# Patient Record
Sex: Female | Born: 1995 | Race: Black or African American | Hispanic: No | Smoking: Never smoker
Health system: Southern US, Community
[De-identification: ages and names within clinical notes are randomized; demographics above are authoritative.]

## PROBLEM LIST (undated history)

## (undated) ENCOUNTER — Emergency Department (HOSPITAL_COMMUNITY): Admission: EM | Payer: Medicaid Other | Source: Home / Self Care

## (undated) DIAGNOSIS — J45909 Unspecified asthma, uncomplicated: Secondary | ICD-10-CM

## (undated) DIAGNOSIS — B379 Candidiasis, unspecified: Secondary | ICD-10-CM

## (undated) DIAGNOSIS — N390 Urinary tract infection, site not specified: Secondary | ICD-10-CM

## (undated) DIAGNOSIS — B9689 Other specified bacterial agents as the cause of diseases classified elsewhere: Secondary | ICD-10-CM

## (undated) DIAGNOSIS — N760 Acute vaginitis: Secondary | ICD-10-CM

## (undated) HISTORY — PX: BREAST SURGERY: SHX581

## (undated) HISTORY — PX: EYE SURGERY: SHX253

---

## 2016-05-02 ENCOUNTER — Encounter (HOSPITAL_COMMUNITY): Payer: Self-pay | Admitting: Emergency Medicine

## 2016-05-02 ENCOUNTER — Other Ambulatory Visit: Payer: Self-pay

## 2016-05-02 ENCOUNTER — Emergency Department (HOSPITAL_COMMUNITY): Payer: Medicaid Other

## 2016-05-02 ENCOUNTER — Emergency Department (HOSPITAL_COMMUNITY)
Admission: EM | Admit: 2016-05-02 | Discharge: 2016-05-02 | Disposition: A | Discharge status: Home / Self Care | Payer: Medicaid Other | Attending: Emergency Medicine | Admitting: Emergency Medicine

## 2016-05-02 DIAGNOSIS — R0789 Other chest pain: Secondary | ICD-10-CM

## 2016-05-02 DIAGNOSIS — R0602 Shortness of breath: Secondary | ICD-10-CM

## 2016-05-02 DIAGNOSIS — J45909 Unspecified asthma, uncomplicated: Secondary | ICD-10-CM | POA: Diagnosis not present

## 2016-05-02 HISTORY — DX: Unspecified asthma, uncomplicated: J45.909

## 2016-05-02 LAB — CBC WITH DIFFERENTIAL/PLATELET
BASOS ABS: 0 10*3/uL (ref 0.0–0.1)
Basophils Relative: 0 %
EOS ABS: 0.2 10*3/uL (ref 0.0–0.7)
EOS PCT: 3 %
HCT: 36.7 % (ref 36.0–46.0)
Hemoglobin: 12.7 g/dL (ref 12.0–15.0)
Lymphocytes Relative: 34 %
Lymphs Abs: 2.4 10*3/uL (ref 0.7–4.0)
MCH: 27.3 pg (ref 26.0–34.0)
MCHC: 34.6 g/dL (ref 30.0–36.0)
MCV: 78.9 fL (ref 78.0–100.0)
Monocytes Absolute: 0.6 10*3/uL (ref 0.1–1.0)
Monocytes Relative: 9 %
Neutro Abs: 3.9 10*3/uL (ref 1.7–7.7)
Neutrophils Relative %: 54 %
PLATELETS: 300 10*3/uL (ref 150–400)
RBC: 4.65 MIL/uL (ref 3.87–5.11)
RDW: 13.3 % (ref 11.5–15.5)
WBC: 7.2 10*3/uL (ref 4.0–10.5)

## 2016-05-02 LAB — URINALYSIS, ROUTINE W REFLEX MICROSCOPIC
BILIRUBIN URINE: NEGATIVE
Glucose, UA: NEGATIVE mg/dL
HGB URINE DIPSTICK: NEGATIVE
Ketones, ur: NEGATIVE mg/dL
Leukocytes, UA: NEGATIVE
NITRITE: NEGATIVE
PH: 6 (ref 5.0–8.0)
Protein, ur: NEGATIVE mg/dL
SPECIFIC GRAVITY, URINE: 1.024 (ref 1.005–1.030)

## 2016-05-02 LAB — I-STAT TROPONIN, ED: Troponin i, poc: 0 ng/mL (ref 0.00–0.08)

## 2016-05-02 LAB — COMPREHENSIVE METABOLIC PANEL
ALBUMIN: 4.1 g/dL (ref 3.5–5.0)
ALT: 13 U/L — AB (ref 14–54)
AST: 15 U/L (ref 15–41)
Alkaline Phosphatase: 49 U/L (ref 38–126)
Anion gap: 6 (ref 5–15)
BUN: 7 mg/dL (ref 6–20)
CHLORIDE: 104 mmol/L (ref 101–111)
CO2: 25 mmol/L (ref 22–32)
CREATININE: 0.73 mg/dL (ref 0.44–1.00)
Calcium: 9.3 mg/dL (ref 8.9–10.3)
GFR calc Af Amer: >60 mL/min (ref >60)
GFR calc non Af Amer: >60 mL/min (ref >60)
Glucose, Bld: 84 mg/dL (ref 65–99)
Potassium: 3.8 mmol/L (ref 3.5–5.1)
SODIUM: 135 mmol/L (ref 135–145)
Total Bilirubin: 0.2 mg/dL — ABNORMAL LOW (ref 0.3–1.2)
Total Protein: 8.2 g/dL — ABNORMAL HIGH (ref 6.5–8.1)

## 2016-05-02 LAB — PREGNANCY, URINE: Preg Test, Ur: NEGATIVE

## 2016-05-02 MED ORDER — ALBUTEROL SULFATE HFA 108 (90 BASE) MCG/ACT IN AERS
2.0000 | INHALATION_SPRAY | Freq: Four times a day (QID) | RESPIRATORY_TRACT | Status: DC | PRN
  Administered 2016-05-02: 2 via RESPIRATORY_TRACT
  Filled 2016-05-02: qty 6.7

## 2016-05-02 NOTE — Discharge Instructions (Signed)
Read the information below.   Your labs were re-assuring.  You were provided an inhaler as needed for shortness of breath. You can take ibuprofen or tylenol for pain relief.  Use the prescribed medication as directed.  Please discuss all new medications with your pharmacist.   Be sure to follow up with your primary care provider in the next week for re-evaluation following your ED visit.  You may return to the Emergency Department at any time for worsening condition or any new symptoms that concern you. Return to ED if your symptoms worsen, develop fever, wheezing, chest pain, trouble breathing, or loss of consciousness.    Nonspecific Chest Pain It is often hard to find the cause of chest pain. There is always a chance that your pain could be related to something serious, such as a heart attack or a blood clot in your lungs. Chest pain can also be caused by conditions that are not life-threatening. If you have chest pain, it is very important to follow up with your doctor.  HOME CARE  If you were prescribed an antibiotic medicine, finish it all even if you start to feel better.  Avoid any activities that cause chest pain.  Do not use any tobacco products, including cigarettes, chewing tobacco, or electronic cigarettes. If you need help quitting, ask your doctor.  Do not drink alcohol.  Take medicines only as told by your doctor.  Keep all follow-up visits as told by your doctor. This is important. This includes any further testing if your chest pain does not go away.  Your doctor may tell you to keep your head raised (elevated) while you sleep.  Make lifestyle changes as told by your doctor. These may include:  Getting regular exercise. Ask your doctor to suggest some activities that are safe for you.  Eating a heart-healthy diet. Your doctor or a diet specialist (dietitian) can help you to learn healthy eating options.  Maintaining a healthy weight.  Managing diabetes, if  necessary.  Reducing stress. GET HELP IF:  Your chest pain does not go away, even after treatment.  You have a rash with blisters on your chest.  You have a fever. GET HELP RIGHT AWAY IF:  Your chest pain is worse.  You have an increasing cough, or you cough up blood.  You have severe belly (abdominal) pain.  You feel extremely weak.  You pass out (faint).  You have chills.  You have sudden, unexplained chest discomfort.  You have sudden, unexplained discomfort in your arms, back, neck, or jaw.  You have shortness of breath at any time.  You suddenly start to sweat, or your skin gets clammy.  You feel nauseous.  You vomit.  You suddenly feel light-headed or dizzy.  Your heart begins to beat quickly, or it feels like it is skipping beats. These symptoms may be an emergency. Do not wait to see if the symptoms will go away. Get medical help right away. Call your local emergency services (911 in the [place] Do not drive yourself to the hospital.   This information is not intended to replace advice given to you by your health care provider. Make sure you discuss any questions you have with your health care provider.   Document Released: 04/18/2008 Document Revised: 11/21/2014 Document Reviewed: 06/06/2014 Elsevier Interactive Patient Education [employer]2016 [employer] Inc.

## 2016-05-02 NOTE — ED Provider Notes (Signed)
CSN: [id number]     Arrival date & time 05/02/16  1828 History   First MD Initiated Contact with Patient 05/02/16 1945     Chief Complaint  Patient presents with  . Asthma     (Consider location/radiation/quality/duration/timing/severity/associated sxs/prior Treatment) HPI Comments: Brittany West is a 20 y.o. Female with remote history of asthma presents to ED with complaint of chest tightness and shortness of breath. Patient states symptoms started a couple of months ago. Chest tightness is centrally located, intermittent, with radiation into both arms, lasting approximately 7 minutes. No identifiable pattern. Nothing makes the tightness better or worse. She has associated shortness of breath. Patient states she is currently asymptomatic. She denies cough, palpitations, or lower le swelling/pain. No fever, chills, or night sweats. She denies recent long distance travel/immobilization/surgery, hemoptysis, h/o blood clots, cancer or cancer treatment, or exogenous estrogen use. No abdominal complaints. She endorses chronic dysuria and states she is on chronic ABX therapy. No numbness/weakness, lightheadedness, or dizziness. She has a remote history of asthma, but does not carry an inhaler and has not had an asthma attack in years. FamHx significant for mom having two MI's in 40's.   The history is provided by the patient.    Past Medical History  Diagnosis Date  . Asthma    History reviewed. No pertinent past surgical history. No family history on file. Social History  Substance Use Topics  . Smoking status: Never Smoker   . Smokeless tobacco: None  . Alcohol Use: No   OB History    No data available     Review of Systems  Respiratory: Positive for chest tightness ( currently asx) and shortness of breath ( currently asx).   Cardiovascular: Positive for chest pain ( currently asx).  Genitourinary: Positive for dysuria ( chronic per pt).  All other systems reviewed and are  negative.     Allergies  Review of patient's allergies indicates not on file.  Home Medications   Prior to Admission medications   Not on File   BP 119/80 mmHg  Pulse 90  Temp(Src) 99.1 F (37.3 C) (Oral)  Resp 18  SpO2 99%  LMP 04/07/2016 Physical Exam  Constitutional: She appears well-developed and well-nourished. No distress.  HENT:  Head: Normocephalic and atraumatic.  Mouth/Throat: Oropharynx is clear and moist. No oropharyngeal exudate.  Eyes: Conjunctivae and EOM are normal. Pupils are equal, round, and reactive to light. Right eye exhibits no discharge. Left eye exhibits no discharge. No scleral icterus.  Neck: Normal range of motion. Neck supple.  Cardiovascular: Normal rate, regular rhythm, normal heart sounds and intact distal pulses.   No murmur heard. Pulmonary/Chest: Effort normal and breath sounds normal. No respiratory distress. She has no wheezes. She exhibits tenderness ( diffuse TTP of chest wall).  Abdominal: Soft. Bowel sounds are normal. There is no tenderness. There is no rebound and no guarding.  Musculoskeletal: Normal range of motion.  Lymphadenopathy:    She has no cervical adenopathy.  Neurological: She is alert. Coordination normal.  Skin: Skin is warm and dry. She is not diaphoretic.  Psychiatric: She has a normal mood and affect. Her behavior is normal.    ED Course  Procedures (including critical care time) Labs Review Labs Reviewed  COMPREHENSIVE METABOLIC PANEL - Abnormal; Notable for the following:    Total Protein 8.2 (*)    ALT 13 (*)    Total Bilirubin 0.2 (*)    All other components within normal limits  CBC WITH DIFFERENTIAL/PLATELET  URINALYSIS, ROUTINE W REFLEX MICROSCOPIC (NOT AT [facility])  PREGNANCY, URINE  I-STAT TROPOININ, ED    Imaging Review Dg Chest 2 View  05/02/2016  CLINICAL DATA:  Chronic unexplained Chest pain, no cardiac hx, non smoker EXAM: CHEST - 2 VIEW COMPARISON:  none FINDINGS: Lungs are clear. Heart size  and mediastinal contours are within normal limits. No effusion.  No pneumothorax. Visualized bones unremarkable. IMPRESSION: No acute cardiopulmonary disease. Electronically Signed   By: Corlis Leak M.D.   On: 05/02/2016 20:45   I have personally reviewed and evaluated these images and lab results as part of my medical decision-making.   EKG Interpretation   Date/Time:  Monday May 02 2016 20:29:33 EDT Ventricular Rate:  70 PR Interval:    QRS Duration: 82 QT Interval:  347 QTC Calculation: 375 R Axis:   45 Text Interpretation:  Sinus arrhythmia Borderline T wave abnormalities No  old tracing to compare Confirmed by GOLDSTON MD, SCOTT [phone]) on  05/02/2016 8:33:45 PM      MDM   Final diagnoses:  Chest tightness  Shortness of breath    Patient is afebrile and non-toxic appearing. She is breathing comfortably and vital signs are stable. Patient currently asx. Labs are re-assuring. CXR negative for effusion, PTX, or PNA. EKG shows sinus arrhythmia with borderline t-wave abnormalities. Troponin negative. Heart score 1. Well's score 0, PERC negative, low suspicion for pulmonary embolus. Doubt dissection - normal blood pressures, equal pulses. Doubt esophageal rupture - no free air under diaphragm.   Discussed results with patient. Suspect may be ?anxiety related vs. ?asthma vs. MSK (TTP of chest wall). Provided rescue inhaler for SOB as needed. Ibuprofen or tylenol for pain relief. Encouraged follow up with PCP. Discussed return precautions. Patient voiced understanding and is agreeable.   Lona Kettle, PA-C 05/03/16 0017  Pricilla Loveless, MD 05/03/16 [phone]

## 2016-05-02 NOTE — ED Notes (Signed)
Per pt, states chest tightness and SOB-history of asthma

## 2016-05-02 NOTE — ED Notes (Signed)
PA said to hold off blood work until she talks to the patient.

## 2016-06-30 ENCOUNTER — Emergency Department (HOSPITAL_COMMUNITY)
Admission: EM | Admit: 2016-06-30 | Discharge: 2016-06-30 | Disposition: A | Discharge status: Home / Self Care | Payer: Medicaid Other | Attending: Emergency Medicine | Admitting: Emergency Medicine

## 2016-06-30 ENCOUNTER — Encounter (HOSPITAL_COMMUNITY): Payer: Self-pay

## 2016-06-30 DIAGNOSIS — R1030 Lower abdominal pain, unspecified: Secondary | ICD-10-CM | POA: Diagnosis present

## 2016-06-30 DIAGNOSIS — N390 Urinary tract infection, site not specified: Secondary | ICD-10-CM | POA: Diagnosis not present

## 2016-06-30 DIAGNOSIS — Z791 Long term (current) use of non-steroidal anti-inflammatories (NSAID): Secondary | ICD-10-CM | POA: Insufficient documentation

## 2016-06-30 DIAGNOSIS — M549 Dorsalgia, unspecified: Secondary | ICD-10-CM

## 2016-06-30 DIAGNOSIS — J45909 Unspecified asthma, uncomplicated: Secondary | ICD-10-CM | POA: Diagnosis not present

## 2016-06-30 HISTORY — DX: Other specified bacterial agents as the cause of diseases classified elsewhere: B96.89

## 2016-06-30 HISTORY — DX: Acute vaginitis: N76.0

## 2016-06-30 HISTORY — DX: Candidiasis, unspecified: B37.9

## 2016-06-30 HISTORY — DX: Urinary tract infection, site not specified: N39.0

## 2016-06-30 LAB — URINALYSIS, ROUTINE W REFLEX MICROSCOPIC
Glucose, UA: NEGATIVE mg/dL
Ketones, ur: NEGATIVE mg/dL
Leukocytes, UA: NEGATIVE
Nitrite: NEGATIVE
Protein, ur: 100 mg/dL — AB
Specific Gravity, Urine: 1.026 (ref 1.005–1.030)
pH: 6 (ref 5.0–8.0)

## 2016-06-30 LAB — URINE MICROSCOPIC-ADD ON

## 2016-06-30 LAB — PREGNANCY, URINE: Preg Test, Ur: NEGATIVE

## 2016-06-30 MED ORDER — CEPHALEXIN 500 MG PO CAPS: 500.0000 mg | ORAL_CAPSULE | Freq: Three times a day (TID) | ORAL | 0 refills | Status: DC

## 2016-06-30 MED ORDER — ONDANSETRON 4 MG PO TBDP
4.0000 mg | ORAL_TABLET | Freq: Once | ORAL | Status: AC
  Administered 2016-06-30: 4 mg via ORAL
  Filled 2016-06-30: qty 1

## 2016-06-30 MED ORDER — CEPHALEXIN 500 MG PO CAPS
500.0000 mg | ORAL_CAPSULE | Freq: Once | ORAL | Status: AC
  Administered 2016-06-30: 500 mg via ORAL
  Filled 2016-06-30: qty 1

## 2016-06-30 MED ORDER — IBUPROFEN 200 MG PO TABS
600.0000 mg | ORAL_TABLET | Freq: Once | ORAL | Status: AC
  Administered 2016-06-30: 600 mg via ORAL
  Filled 2016-06-30: qty 3

## 2016-06-30 MED ORDER — METRONIDAZOLE 500 MG PO TABS
2000.0000 mg | ORAL_TABLET | Freq: Once | ORAL | Status: AC
  Administered 2016-06-30: 2000 mg via ORAL
  Filled 2016-06-30: qty 4

## 2016-06-30 MED ORDER — OXYCODONE-ACETAMINOPHEN 5-325 MG PO TABS
1.0000 | ORAL_TABLET | Freq: Once | ORAL | Status: AC
  Administered 2016-06-30: 1 via ORAL
  Filled 2016-06-30: qty 1

## 2016-06-30 MED ORDER — LORAZEPAM 0.5 MG PO TABS
0.5000 mg | ORAL_TABLET | Freq: Once | ORAL | Status: AC
  Administered 2016-06-30: 0.5 mg via ORAL
  Filled 2016-06-30: qty 1

## 2016-06-30 NOTE — ED Notes (Signed)
Pt was seen by PCP 2 days ago. DX with Bacterial Vaginosis. HX of the same. Given RX however did not get filled. Pt states she has increased pain yesterday. Denies discharge, odor however states pain with urination. Pain in back and menstrual cramps. Denies N/V/D

## 2016-06-30 NOTE — ED Notes (Signed)
Bed: [id number] Expected date:  Expected time:  Means of arrival:  Comments: EMS - Back/Abdominal pain

## 2016-06-30 NOTE — ED Notes (Signed)
Patient given paper scrub pants to wear home due to shorts dirty.

## 2016-06-30 NOTE — ED Notes (Signed)
In and out not performed

## 2016-06-30 NOTE — ED Notes (Signed)
Per MD hold off on the labs

## 2016-06-30 NOTE — ED Notes (Signed)
Pt declines in and out. Pt only uses pads with menstrual cycle. Aware of contamination.

## 2016-06-30 NOTE — ED Provider Notes (Signed)
[facility]-EMERGENCY DEPT Provider Note   CSN: [id number] Arrival date & time: 06/30/16  [id number]     History   Chief Complaint Chief Complaint  Patient presents with  . Back Pain  . Abdominal Pain    HPI Brittany West is a 20 y.o. female.  HPI   19yF with lower abdominal pain, back pain and dysuria. Reportedly just recently seen and had pelvic exam. Diagnosed with BV and prescribed meds but did not fill. Her symptoms have continued/progressed. Back pain now constant. Worse with some movement. Currently having period. Denies unusual bleeding or discharge. No fever or chills. Doesn't think she is pregnant.   Past Medical History:  Diagnosis Date  . Asthma   . Bacterial vaginosis   . UTI (lower urinary tract infection)   . Yeast infection     There are no active problems to display for this patient.   Past Surgical History:  Procedure Laterality Date  . EYE SURGERY      OB History    No data available       Home Medications    Prior to Admission medications   Medication Sig Start Date End Date Taking? Authorizing Provider  ibuprofen (ADVIL,MOTRIN) 200 MG tablet Take 400 mg by mouth every 6 (six) hours as needed for mild pain or moderate pain.   Yes Historical Provider, MD    Family History No family history on file.  Social History Social History  Substance Use Topics  . Smoking status: Never Smoker  . Smokeless tobacco: Never Used  . Alcohol use No     Allergies   Review of patient's allergies indicates no known allergies.   Review of Systems Review of Systems  All systems reviewed and negative, other than as noted in HPI.  Physical Exam Updated Vital Signs BP 106/62 (BP Location: Right Arm)   Pulse 97   Temp 99.8 F (37.7 C) (Oral)   Resp 18   Ht 5\' 3"  (1.6 m)   Wt 189 lb (85.7 kg)   LMP 06/30/2016 (Exact Date)   SpO2 99%   BMI 33.48 kg/m   Physical Exam  Constitutional: She appears well-developed and well-nourished. No distress.  HENT:    Head: Normocephalic and atraumatic.  Eyes: Conjunctivae are normal.  Neck: Neck supple.  Cardiovascular: Normal rate and regular rhythm.   No murmur heard. Pulmonary/Chest: Effort normal and breath sounds normal. No respiratory distress.  Abdominal: Soft. There is tenderness.  Mild suprapubic tenderness  Genitourinary:  Genitourinary Comments: No cva tenderness  Musculoskeletal: She exhibits no edema.  Neurological: She is alert.  Skin: Skin is warm and dry.  Psychiatric: She has a normal mood and affect.  Nursing note and vitals reviewed.    ED Treatments / Results  Labs (all labs ordered are listed, but only abnormal results are displayed) Labs Reviewed  URINALYSIS, ROUTINE W REFLEX [facility]CROSCOPIC ([facility]) - Abnormal; Notable for the following:       Result Value   Color, Urine AMBER (*)    APPearance CLOUDY (*)    Hgb urine dipstick LARGE (*)    Bilirubin Urine SMALL (*)    Protein, ur 100 (*)    All other components within normal limits  URINE MICROSCOPIC-ADD ON - Abnormal; Notable for the following:    Squamous Epithelial / LPF 0-5 (*)    Bacteria, UA MANY (*)    All other components within normal limits  PREGNANCY, URINE    EKG  EKG Interpretation None  Radiology No results found.  Procedures Procedures (including critical care time)  Medications Ordered in ED Medications  cephALEXin (KEFLEX) capsule 500 mg (not administered)  ibuprofen (ADVIL,MOTRIN) tablet 600 mg (600 mg Oral Given 06/30/16 0951)  oxyCODONE-acetaminophen (PERCOCET/ROXICET) 5-325 MG per tablet 1 tablet (1 tablet Oral Given 06/30/16 0951)  metroNIDAZOLE (FLAGYL) tablet 2,000 mg (2,000 mg Oral Given 06/30/16 0950)  ondansetron (ZOFRAN-ODT) disintegrating tablet 4 mg (4 mg Oral Given 06/30/16 0951)  LORazepam (ATIVAN) tablet 0.5 mg (0.5 mg Oral Given 06/30/16 0951)     Initial Impression / Assessment and Plan / ED Course  I have reviewed the triage vital signs and the nursing  notes.  Pertinent labs & imaging results that were available during my care of the patient were reviewed by me and considered in my medical decision making (see chart for details).  Clinical Course    20 year old female with lower abdominal pain, back pain and dysuria. She reports recently diagnosed bacterial vaginosis but did not fill her prescriptions. To help with compliance, we'll give a single dose of metronidazole. Her urinalysis today is consistent with UTI. We'll initially start on Keflex. She states that she had a pelvic exam at the time she was diagnosed with BV. She is not pregnant. It has been determined that no acute conditions requiring further emergency intervention are present at this time. The patient has been advised of the diagnosis and plan. I reviewed any labs and imaging including any potential incidental findings. We have discussed signs and symptoms that warrant return to the ED and they are listed in the discharge instructions.    Final Clinical Impressions(s) / ED Diagnoses   Final diagnoses:  UTI (lower urinary tract infection)  Back pain, unspecified location    New Prescriptions New Prescriptions   No medications on file     Raeford RazorStephen Jose Corvin, MD 06/30/16 1001

## 2016-06-30 NOTE — ED Triage Notes (Signed)
Per [facility]- Pt resides at home. Pt seen by PCP yesterday and DX with ? Bacterial infection (UA and pelvic performed at PCP) RX given yet not filled. Denies fever. Pt currently on  menstrual  Cycle.

## 2016-07-03 ENCOUNTER — Emergency Department (HOSPITAL_COMMUNITY): Payer: Medicaid Other

## 2016-07-03 ENCOUNTER — Emergency Department (HOSPITAL_COMMUNITY)
Admission: EM | Admit: 2016-07-03 | Discharge: 2016-07-03 | Disposition: A | Discharge status: Home / Self Care | Payer: Medicaid Other | Attending: Emergency Medicine | Admitting: Emergency Medicine

## 2016-07-03 ENCOUNTER — Encounter (HOSPITAL_COMMUNITY): Payer: Self-pay | Admitting: Emergency Medicine

## 2016-07-03 DIAGNOSIS — R51 Headache: Secondary | ICD-10-CM | POA: Insufficient documentation

## 2016-07-03 DIAGNOSIS — J45909 Unspecified asthma, uncomplicated: Secondary | ICD-10-CM | POA: Insufficient documentation

## 2016-07-03 DIAGNOSIS — N390 Urinary tract infection, site not specified: Secondary | ICD-10-CM | POA: Insufficient documentation

## 2016-07-03 DIAGNOSIS — R109 Unspecified abdominal pain: Secondary | ICD-10-CM | POA: Diagnosis present

## 2016-07-03 DIAGNOSIS — Z791 Long term (current) use of non-steroidal anti-inflammatories (NSAID): Secondary | ICD-10-CM | POA: Diagnosis not present

## 2016-07-03 DIAGNOSIS — M545 Low back pain: Secondary | ICD-10-CM

## 2016-07-03 LAB — URINALYSIS, ROUTINE W REFLEX MICROSCOPIC
BILIRUBIN URINE: NEGATIVE
Glucose, UA: NEGATIVE mg/dL
Ketones, ur: NEGATIVE mg/dL
Leukocytes, UA: NEGATIVE
Nitrite: NEGATIVE
PROTEIN: NEGATIVE mg/dL
Specific Gravity, Urine: 1.023 (ref 1.005–1.030)
pH: 5.5 (ref 5.0–8.0)

## 2016-07-03 LAB — COMPREHENSIVE METABOLIC PANEL
ALT: 49 U/L (ref 14–54)
ANION GAP: 4 — AB (ref 5–15)
AST: 54 U/L — ABNORMAL HIGH (ref 15–41)
Albumin: 3.6 g/dL (ref 3.5–5.0)
Alkaline Phosphatase: 53 U/L (ref 38–126)
BUN: 6 mg/dL (ref 6–20)
CHLORIDE: 107 mmol/L (ref 101–111)
CO2: 28 mmol/L (ref 22–32)
Calcium: 9 mg/dL (ref 8.9–10.3)
Creatinine, Ser: 0.77 mg/dL (ref 0.44–1.00)
Glucose, Bld: 100 mg/dL — ABNORMAL HIGH (ref 65–99)
POTASSIUM: 3.8 mmol/L (ref 3.5–5.1)
Sodium: 139 mmol/L (ref 135–145)
Total Bilirubin: 0.5 mg/dL (ref 0.3–1.2)
Total Protein: 7.6 g/dL (ref 6.5–8.1)

## 2016-07-03 LAB — CBC WITH DIFFERENTIAL/PLATELET
Basophils Absolute: 0.1 10*3/uL (ref 0.0–0.1)
Basophils Relative: 3 %
EOS PCT: 5 %
Eosinophils Absolute: 0.2 10*3/uL (ref 0.0–0.7)
HEMATOCRIT: 35.3 % — AB (ref 36.0–46.0)
Hemoglobin: 11.5 g/dL — ABNORMAL LOW (ref 12.0–15.0)
Lymphocytes Relative: 56 %
Lymphs Abs: 2.9 10*3/uL (ref 0.7–4.0)
MCH: 26.3 pg (ref 26.0–34.0)
MCHC: 32.6 g/dL (ref 30.0–36.0)
MCV: 80.6 fL (ref 78.0–100.0)
MONO ABS: 0.4 10*3/uL (ref 0.1–1.0)
MONOS PCT: 9 %
NEUTROS PCT: 27 %
Neutro Abs: 1.3 10*3/uL — ABNORMAL LOW (ref 1.7–7.7)
PLATELETS: 193 10*3/uL (ref 150–400)
RBC: 4.38 MIL/uL (ref 3.87–5.11)
RDW: 13.1 % (ref 11.5–15.5)
WBC: 4.9 10*3/uL (ref 4.0–10.5)

## 2016-07-03 LAB — URINE MICROSCOPIC-ADD ON

## 2016-07-03 LAB — POC URINE PREG, ED: PREG TEST UR: NEGATIVE

## 2016-07-03 MED ORDER — ACETAMINOPHEN 325 MG PO TABS
650.0000 mg | ORAL_TABLET | Freq: Once | ORAL | Status: AC
  Administered 2016-07-03: 650 mg via ORAL
  Filled 2016-07-03: qty 2

## 2016-07-03 MED ORDER — ACIDOPHILUS PROBIOTIC 10 MG PO TABS: 10.0000 mg | ORAL_TABLET | Freq: Three times a day (TID) | ORAL | 0 refills | Status: AC

## 2016-07-03 MED ORDER — NAPROXEN 500 MG PO TABS: 500.0000 mg | ORAL_TABLET | Freq: Two times a day (BID) | ORAL | 0 refills | Status: AC

## 2016-07-03 MED ORDER — SODIUM CHLORIDE 0.9 % IV BOLUS (SEPSIS)
1000.0000 mL | Freq: Once | INTRAVENOUS | Status: AC
  Administered 2016-07-03: 1000 mL via INTRAVENOUS

## 2016-07-03 MED ORDER — LOPERAMIDE HCL 2 MG PO CAPS: 2.0000 mg | ORAL_CAPSULE | Freq: Four times a day (QID) | ORAL | 0 refills | Status: AC | PRN

## 2016-07-03 MED ORDER — CEPHALEXIN 500 MG PO CAPS: 500.0000 mg | ORAL_CAPSULE | Freq: Four times a day (QID) | ORAL | 0 refills | Status: DC

## 2016-07-03 NOTE — ED Provider Notes (Signed)
[facility]-EMERGENCY DEPT Provider Note   CSN: [id number] Arrival date & time: 07/03/16  1911  By signing my name below, I, Christel Mormon, attest that this documentation has been prepared under the direction and in the presence of Everlene Farrier, PA-C. Electronically Signed: Christel Mormon, Scribe. 07/03/2016. 7:48 PM.    History   Chief Complaint Chief Complaint  Patient presents with  . Back Pain   The history is provided by the patient. No language interpreter was used.   HPI Comments:  Brittany West is a 20 y.o. female with a PMHx of UTI who presents to the Emergency Department complaining of sudden onset, waxing and waning lower R back pain x approximately 2 days. Pt notes that pain radiates to her neck. Pt complains of headache, chills, sweats, weakness, subjective fever, and diarrhea. Pt notes that pain is exacerbated when standing.  She was seen on 8/17 for UTI sx and was given Keflex which she notes she stopped this today due having diarrhea. She reports her urinary symptoms have resolved. She notes taking ibuprofen to alleviate her pain with no relief. Pt is currently menstruating. Pt denies previous kidney stones, vaginal bleeding, vaginal discharge, abdominal pain, nausea, vomiting, urinary frequency, urinary urgency, hematuria, and dysuria.   Past Medical History:  Diagnosis Date  . Asthma   . Bacterial vaginosis   . UTI (lower urinary tract infection)   . Yeast infection     There are no active problems to display for this patient.   Past Surgical History:  Procedure Laterality Date  . EYE SURGERY      OB History    No data available       Home Medications    Prior to Admission medications   Medication Sig Start Date End Date Taking? Authorizing Provider  ibuprofen (ADVIL,MOTRIN) 200 MG tablet Take 400 mg by mouth every 6 (six) hours as needed for mild pain or moderate pain.   Yes Historical Provider, MD  cephALEXin (KEFLEX) 500 MG capsule Take 1 capsule (500  mg total) by mouth 4 (four) times daily. 07/03/16   Everlene Farrier, PA-C  Lactobacillus (ACIDOPHILUS PROBIOTIC) 10 MG TABS Take 10 mg by mouth 3 (three) times daily. 07/03/16   Everlene Farrier, PA-C  loperamide (IMODIUM) 2 MG capsule Take 1 capsule (2 mg total) by mouth 4 (four) times daily as needed for diarrhea or loose stools. 07/03/16   Everlene Farrier, PA-C  naproxen (NAPROSYN) 500 MG tablet Take 1 tablet (500 mg total) by mouth 2 (two) times daily with a meal. 07/03/16   Everlene Farrier, PA-C    Family History History reviewed. No pertinent family history.  Social History Social History  Substance Use Topics  . Smoking status: Never Smoker  . Smokeless tobacco: Never Used  . Alcohol use No     Allergies   Review of patient's allergies indicates no known allergies.   Review of Systems Review of Systems  Constitutional: Positive for chills and fever (Subjective).  HENT: Negative for congestion and sore throat.   Eyes: Negative for visual disturbance.  Respiratory: Negative for shortness of breath.   Cardiovascular: Negative for chest pain.  Gastrointestinal: Positive for diarrhea. Negative for abdominal pain, nausea and vomiting.  Genitourinary: Negative for decreased urine volume, difficulty urinating, dysuria, frequency, hematuria, menstrual problem, urgency, vaginal bleeding, vaginal discharge and vaginal pain.  Musculoskeletal: Positive for back pain. Negative for gait problem and neck pain.  Skin: Negative for rash and wound.  Neurological: Positive for headaches. Negative for dizziness,  syncope, weakness, light-headedness and numbness.     Physical Exam Updated Vital Signs BP 104/65   Pulse 84   Temp 99 F (37.2 C) (Oral)   Resp 18   Ht 5\' 3"  (1.6 m)   Wt 85.7 kg   LMP 07/03/2016   SpO2 99%   BMI 33.48 kg/m   Physical Exam  Constitutional: She appears well-developed and well-nourished. No distress.  Nontoxic appearing.  HENT:  Head: Normocephalic and  atraumatic.  Mouth/Throat: Oropharynx is clear and moist.  Eyes: Conjunctivae are normal. Pupils are equal, round, and reactive to light. Right eye exhibits no discharge. Left eye exhibits no discharge.  Neck: Neck supple.  Cardiovascular: Normal rate, regular rhythm, normal heart sounds and intact distal pulses.  Exam reveals no gallop and no friction rub.   No murmur heard. Pulmonary/Chest: Effort normal and breath sounds normal. No respiratory distress. She has no wheezes. She has no rales.  Abdominal: Soft. She exhibits no distension and no mass. There is no tenderness. There is no rebound and no guarding.  Abdomen is soft. Bowel sounds are present. Abdomen is nontender to palpation. She does have some right low back tenderness to palpation. She is tender around her flank as well as down to her low back. It is hard to determine if this is more CVA tenderness or muscle back pain.  Musculoskeletal: Normal range of motion. She exhibits tenderness. She exhibits no edema.  Mild right low back tenderness to palpation. No midline back tenderness. No back erythema, deformity, ecchymosis or warmth. No lower extremity edema or tenderness. Good strength to bilateral lower extremities.  Lymphadenopathy:    She has no cervical adenopathy.  Neurological: She is alert. She has normal reflexes. She displays normal reflexes. Coordination normal.  Normal gait. Sensation is intact to her bilateral upper and lower extremities. Bilateral patellar DTRs are intact.  Skin: Skin is warm and dry. Capillary refill takes less than 2 seconds. No rash noted. She is not diaphoretic. No erythema. No pallor.  Psychiatric: She has a normal mood and affect. Her behavior is normal.  Nursing note and vitals reviewed.    ED Treatments / Results  DIAGNOSTIC STUDIES:  Oxygen Saturation is 99% on RA, normal by my interpretation.    COORDINATION OF CARE:  7:48 PM Will order blood work. Discussed treatment plan with pt at  bedside and pt agreed to plan.  Labs (all labs ordered are listed, but only abnormal results are displayed) Labs Reviewed  URINALYSIS, ROUTINE W REFLEX MICROSCOPIC (NOT AT [facility]) - Abnormal; Notable for the following:       Result Value   APPearance CLOUDY (*)    Hgb urine dipstick LARGE (*)    All other components within normal limits  COMPREHENSIVE METABOLIC PANEL - Abnormal; Notable for the following:    Glucose, Bld 100 (*)    AST 54 (*)    Anion gap 4 (*)    All other components within normal limits  CBC WITH DIFFERENTIAL/PLATELET - Abnormal; Notable for the following:    Hemoglobin 11.5 (*)    HCT 35.3 (*)    Neutro Abs 1.3 (*)    All other components within normal limits  URINE MICROSCOPIC-ADD ON - Abnormal; Notable for the following:    Squamous Epithelial / LPF 6-30 (*)    Bacteria, UA RARE (*)    All other components within normal limits  URINE CULTURE  POC URINE PREG, ED    EKG  EKG Interpretation None  Radiology Ct Renal Stone Study  Result Date: 07/03/2016 CLINICAL DATA:  20 year old female with lower back and right flank pain. Treated 3 days ago for urinary tract infection. Continued pain and chills. Gross hematuria for 1 week. Initial encounter. EXAM: CT ABDOMEN AND PELVIS WITHOUT CONTRAST TECHNIQUE: Multidetector CT imaging of the abdomen and pelvis was performed following the standard protocol without IV contrast. COMPARISON:  Chest radiographs 05/02/2016. FINDINGS: Negative lung bases.  No pericardial or pleural effusion. Mild scoliosis.  Otherwise no osseous abnormality identified. Small volume pelvic free fluid in the cul-de-sac. Negative noncontrast uterus and adnexa. The distal colon is redundant with retained stool, but otherwise negative. Retained stool in the left colon and transverse colon. Lesser retained stool in the right colon. Normal appendix tracking along the right pelvic sidewall from the cecum (coronal image 73). Negative noncontrast  terminal ileum. No dilated small bowel. Negative stomach and duodenum. No abdominal free air or free fluid. Noncontrast liver, gallbladder (contracted), spleen, pancreas and adrenal glands are normal. Negative noncontrast left kidney. Proximal left ureter is not dilated. The distal left ureter is difficult to delineate. The urinary bladder is almost completely decompressed. The noncontrast right kidney also appears within normal limits. The proximal right ureter is not dilated. The distal right ureter is difficult to delineate. There is only a punctate calcification along the right pelvic floor which is too posterior to be along the course of the right ureter. IMPRESSION: No urologic calculus identified. Negative noncontrast CT appearance of the kidneys and urinary bladder. Trace pelvic free fluid, but likely physiologic. Normal appendix. Electronically Signed   By: Odessa Fleming M.D.   On: 07/03/2016 21:25    Procedures Procedures (including critical care time)  Medications Ordered in ED Medications  sodium chloride 0.9 % bolus 1,000 mL (1,000 mLs Intravenous New Bag/Given 07/03/16 2043)  acetaminophen (TYLENOL) tablet 650 mg (650 mg Oral Given 07/03/16 2044)     Initial Impression / Assessment and Plan / ED Course  I have reviewed the triage vital signs and the nursing notes.  Pertinent labs & imaging results that were available during my care of the patient were reviewed by me and considered in my medical decision making (see chart for details).  Clinical Course   Patient presented complaining of subjective fevers, body aches and right back pain. She was seen 3 days ago for urinary tract infection and started on Keflex. Her urine was not sent for culture at that time. She reports subjective fevers at home. She is afebrile and nontoxic appearing on my exam. She does have some right low back pain that can extend up into her flank. Patient does describe some colicky like pain. Concern for possible renal  stone with her hematuria and her recent urine. Pregnancy test is negative. CBC and CMP are unremarkable. Urinalysis is nitrite and leukocyte negative. It shows large hemoglobin. Patient is on her period. Rare bacteria and 0-5 white blood cells. CT renal stone study shows no evidence of renal stone. CT is unremarkable. With patient's subjective fevers and back pain there is some concern for pyelonephritis. Will increase her dose to 4 times a day of Keflex to cover for pyelonephritis. I encouraged her to take a probiotic as well as loperamide if she has any further diarrhea. She had no diarrhea in the emergency department today. She reports feeling better after fluid bolus and Tylenol. Discussed using naproxen to help with her pain and subjective fevers. I discussed certain specific return precautions. I advised if she  continues to have fevers 48 hours now she needs to be reevaluated. I advised the patient to follow-up with their primary care provider this week. I advised the patient to return to the emergency department with new or worsening symptoms or new concerns. The patient verbalized understanding and agreement with plan.    I personally performed the services described in this documentation, which was scribed in my presence. The recorded information has been reviewed and is accurate.      Final Clinical Impressions(s) / ED Diagnoses   Final diagnoses:  UTI (lower urinary tract infection)  Right low back pain, with sciatica presence unspecified    New Prescriptions New Prescriptions   CEPHALEXIN (KEFLEX) 500 MG CAPSULE    Take 1 capsule (500 mg total) by mouth 4 (four) times daily.   LACTOBACILLUS (ACIDOPHILUS PROBIOTIC) 10 MG TABS    Take 10 mg by mouth 3 (three) times daily.   LOPERAMIDE (IMODIUM) 2 MG CAPSULE    Take 1 capsule (2 mg total) by mouth 4 (four) times daily as needed for diarrhea or loose stools.   NAPROXEN (NAPROSYN) 500 MG TABLET    Take 1 tablet (500 mg total) by mouth 2  (two) times daily with a meal.       Everlene FarrierWilliam Tijah Hane, PA-C 07/03/16 2147    Derwood KaplanAnkit Nanavati, MD 07/04/16 [id number]

## 2016-07-03 NOTE — ED Notes (Signed)
Bed: [id number] Expected date:  Expected time:  Means of arrival:  Comments: Pt from [facility]

## 2016-07-03 NOTE — ED Triage Notes (Signed)
Pt reports having lower back pain and was treated on 06/30/16 for UTI and prescribed Keflex. Pt states she is still having pain in back and chills but no urinary symptoms at this time.

## 2016-07-05 LAB — URINE CULTURE

## 2016-09-18 ENCOUNTER — Emergency Department (HOSPITAL_COMMUNITY)
Admission: EM | Admit: 2016-09-18 | Discharge: 2016-09-18 | Disposition: A | Discharge status: Home / Self Care | Payer: Medicaid Other | Attending: Emergency Medicine | Admitting: Emergency Medicine

## 2016-09-18 ENCOUNTER — Encounter (HOSPITAL_COMMUNITY): Payer: Self-pay

## 2016-09-18 DIAGNOSIS — N390 Urinary tract infection, site not specified: Secondary | ICD-10-CM | POA: Insufficient documentation

## 2016-09-18 DIAGNOSIS — N760 Acute vaginitis: Secondary | ICD-10-CM | POA: Insufficient documentation

## 2016-09-18 DIAGNOSIS — B9689 Other specified bacterial agents as the cause of diseases classified elsewhere: Secondary | ICD-10-CM | POA: Insufficient documentation

## 2016-09-18 DIAGNOSIS — J45909 Unspecified asthma, uncomplicated: Secondary | ICD-10-CM | POA: Insufficient documentation

## 2016-09-18 DIAGNOSIS — R300 Dysuria: Secondary | ICD-10-CM | POA: Diagnosis present

## 2016-09-18 LAB — WET PREP, GENITAL
SPERM: NONE SEEN
Trich, Wet Prep: NONE SEEN
YEAST WET PREP: NONE SEEN

## 2016-09-18 LAB — URINALYSIS, ROUTINE W REFLEX MICROSCOPIC
BILIRUBIN URINE: NEGATIVE
Glucose, UA: NEGATIVE mg/dL
Hgb urine dipstick: NEGATIVE
Ketones, ur: NEGATIVE mg/dL
NITRITE: NEGATIVE
PROTEIN: NEGATIVE mg/dL
SPECIFIC GRAVITY, URINE: 1.023 (ref 1.005–1.030)
pH: 6 (ref 5.0–8.0)

## 2016-09-18 LAB — URINE MICROSCOPIC-ADD ON

## 2016-09-18 MED ORDER — CEPHALEXIN 500 MG PO CAPS
500.0000 mg | ORAL_CAPSULE | Freq: Once | ORAL | Status: AC
  Administered 2016-09-18: 500 mg via ORAL
  Filled 2016-09-18: qty 1

## 2016-09-18 MED ORDER — METRONIDAZOLE 500 MG PO TABS: 500.0000 mg | ORAL_TABLET | Freq: Two times a day (BID) | ORAL | 0 refills | Status: AC

## 2016-09-18 MED ORDER — METRONIDAZOLE 500 MG PO TABS
500.0000 mg | ORAL_TABLET | Freq: Once | ORAL | Status: AC
  Administered 2016-09-18: 500 mg via ORAL
  Filled 2016-09-18: qty 1

## 2016-09-18 MED ORDER — CEPHALEXIN 500 MG PO CAPS: 500.0000 mg | ORAL_CAPSULE | Freq: Two times a day (BID) | ORAL | 0 refills | Status: AC

## 2016-09-18 NOTE — ED Triage Notes (Addendum)
Pt c/o urinary frequency and burning with urination x 3-4 days. Pt has a hx of UTIs. Denies hematuria. Endorses scant "clumpy" discharge. Denies any pain at this time, but states that sometimes it feels like her bladder is "throbbing." Pt also would like to be tested for STDs. A&Ox4. Ambulatory.

## 2016-09-18 NOTE — ED Notes (Signed)
No respiratory or acute distress noted alert and oriented x 3 call light in reach. 

## 2016-09-18 NOTE — ED Provider Notes (Signed)
Emergency Department Provider Note   I have reviewed the triage vital signs and the nursing notes.   HISTORY  Chief Complaint Dysuria and Exposure to STD   HPI Brittany West is a 20 y.o. female presents to the emergency department for evaluation of suprapubic abdominal discomfort and dysuria. Patient has had symptoms for several days. No fever. She reports some mild to moderate vaginal discharge. She states she like to be tested for sexually transmitted infections. She does not have a serious concern for exposure. She has had prior UTI and BV in the past and symptoms feel similar. No antibiotics in the last month.     Past Medical History:  Diagnosis Date  . Asthma   . Bacterial vaginosis   . UTI (lower urinary tract infection)   . Yeast infection     There are no active problems to display for this patient.   Past Surgical History:  Procedure Laterality Date  . EYE SURGERY      Current Outpatient Rx  . Order #: [id number] Class: Historical Med  . Order #: [id number] Class: Print  . Order #: [id number] Class: Print  . Order #: [id number] Class: Print  . Order #: [id number] Class: Print  . Order #: [id number] Class: Print    Allergies Patient has no known allergies.  History reviewed. No pertinent family history.  Social History Social History  Substance Use Topics  . Smoking status: Never Smoker  . Smokeless tobacco: Never Used  . Alcohol use No    Review of Systems  Constitutional: No fever/chills Eyes: No visual changes. ENT: No sore throat. Cardiovascular: Denies chest pain. Respiratory: Denies shortness of breath. Gastrointestinal: suprapubic abdominal pain.  No nausea, no vomiting.  No diarrhea.  No constipation. Genitourinary: Positive dysuria and vaginal discharge.  Musculoskeletal: Negative for back pain. Skin: Negative for rash. Neurological: Negative for headaches, focal weakness or numbness.  10-point ROS otherwise  negative.  ____________________________________________   PHYSICAL EXAM:  VITAL SIGNS: ED Triage Vitals [09/18/16 1939]  Enc Vitals Group     BP 139/86     Pulse Rate 90     Resp 20     Temp 98.8 F (37.1 C)     Temp Source Oral     SpO2 100 %     Weight 188 lb (85.3 kg)   Constitutional: Alert and oriented. Well appearing and in no acute distress. Eyes: Conjunctivae are normal.  Head: Atraumatic. Nose: No congestion/rhinnorhea. Mouth/Throat: Mucous membranes are moist.  Neck: No stridor.  Cardiovascular: Normal rate, regular rhythm. Good peripheral circulation. Grossly normal heart sounds.   Respiratory: Normal respiratory effort.  No retractions. Lungs CTAB. Gastrointestinal: Soft and nontender. No distention.  Genitourinary: Mild vaginal discharge. No bleeding. No CMT. No adnexal tenderness or fullness.  Musculoskeletal: No lower extremity tenderness nor edema. No gross deformities of extremities. Neurologic:  Normal speech and language. No gross focal neurologic deficits are appreciated.  Skin:  Skin is warm, dry and intact. No rash noted.  ____________________________________________   LABS (all labs ordered are listed, but only abnormal results are displayed)  Labs Reviewed  WET PREP, GENITAL - Abnormal; Notable for the following:       Result Value   Clue Cells Wet Prep HPF POC PRESENT (*)    WBC, Wet Prep HPF POC MANY (*)    All other components within normal limits  URINALYSIS, ROUTINE W REFLEX MICROSCOPIC (NOT AT [facility]) - Abnormal; Notable for the following:    APPearance CLOUDY (*)    Leukocytes,  UA LARGE (*)    All other components within normal limits  URINE MICROSCOPIC-ADD ON - Abnormal; Notable for the following:    Squamous Epithelial / LPF 6-30 (*)    Bacteria, UA MANY (*)    All other components within normal limits  GC/CHLAMYDIA PROBE AMP ([place]) NOT AT [facility]   ____________________________________________   PROCEDURES  Procedure(s)  performed:   Procedures  None ____________________________________________   INITIAL IMPRESSION / ASSESSMENT AND PLAN / ED COURSE  Pertinent labs & imaging results that were available during my care of the patient were reviewed by me and considered in my medical decision making (see chart for details).  Patient presents emergency department for evaluation of suprapubic abdominal pain and dysuria. Mild vaginal discharge. Patient also wants to be checked for suture transmitted infection. I offered to treat her present to the for gonorrhea and chlamydia but the patient refused. Pelvic exam with mild to moderate vaginal discharge. No bleeding. No cervical motion tenderness. Plan to treat UTI and BV. Patient will be called if STD screening returns positive.   At this time, I do not feel there is any life-threatening condition present. I have reviewed and discussed all results (EKG, imaging, lab, urine as appropriate), exam findings with patient. I have reviewed nursing notes and appropriate previous records.  I feel the patient is safe to be discharged home without further emergent workup. Discussed usual and customary return precautions. Patient and family (if present) verbalize understanding and are comfortable with this plan.  Patient will follow-up with their primary care provider. If they do not have a primary care provider, information for follow-up has been provided to them. All questions have been answered.  ____________________________________________  FINAL CLINICAL IMPRESSION(S) / ED DIAGNOSES  Final diagnoses:  Urinary tract infection without hematuria, site unspecified  BV (bacterial vaginosis)     MEDICATIONS GIVEN DURING THIS VISIT:  Medications  cephALEXin (KEFLEX) capsule 500 mg (500 mg Oral Given 09/18/16 2314)  metroNIDAZOLE (FLAGYL) tablet 500 mg (500 mg Oral Given 09/18/16 2314)     NEW OUTPATIENT MEDICATIONS STARTED DURING THIS VISIT:  Discharge Medication List as  of 09/18/2016 11:07 PM    START taking these medications   Details  metroNIDAZOLE (FLAGYL) 500 MG tablet Take 1 tablet (500 mg total) by mouth 2 (two) times daily., Starting Sun 09/18/2016, Print       Also started Keflex for 7 days.    Note:  This document was prepared using Dragon voice recognition software and may include unintentional dictation errors.  Alona BeneJoshua Shirl [place], MD Emergency Medicine   Maia PlanJoshua G Jeanet Lupe, MD 09/19/16 [phone]

## 2016-09-18 NOTE — Discharge Instructions (Signed)

## 2016-09-19 LAB — GC/CHLAMYDIA PROBE AMP (~~LOC~~) NOT AT ARMC
CHLAMYDIA, DNA PROBE: NEGATIVE
NEISSERIA GONORRHEA: NEGATIVE

## 2017-02-07 IMAGING — CR DG CHEST 2V
2 series · 2 of 2 positions shown · non-contrast
Comparison: none

CLINICAL DATA: Chronic unexplained Chest pain, no cardiac hx, non
smoker

EXAM:
CHEST - 2 VIEW

[w chest pa]
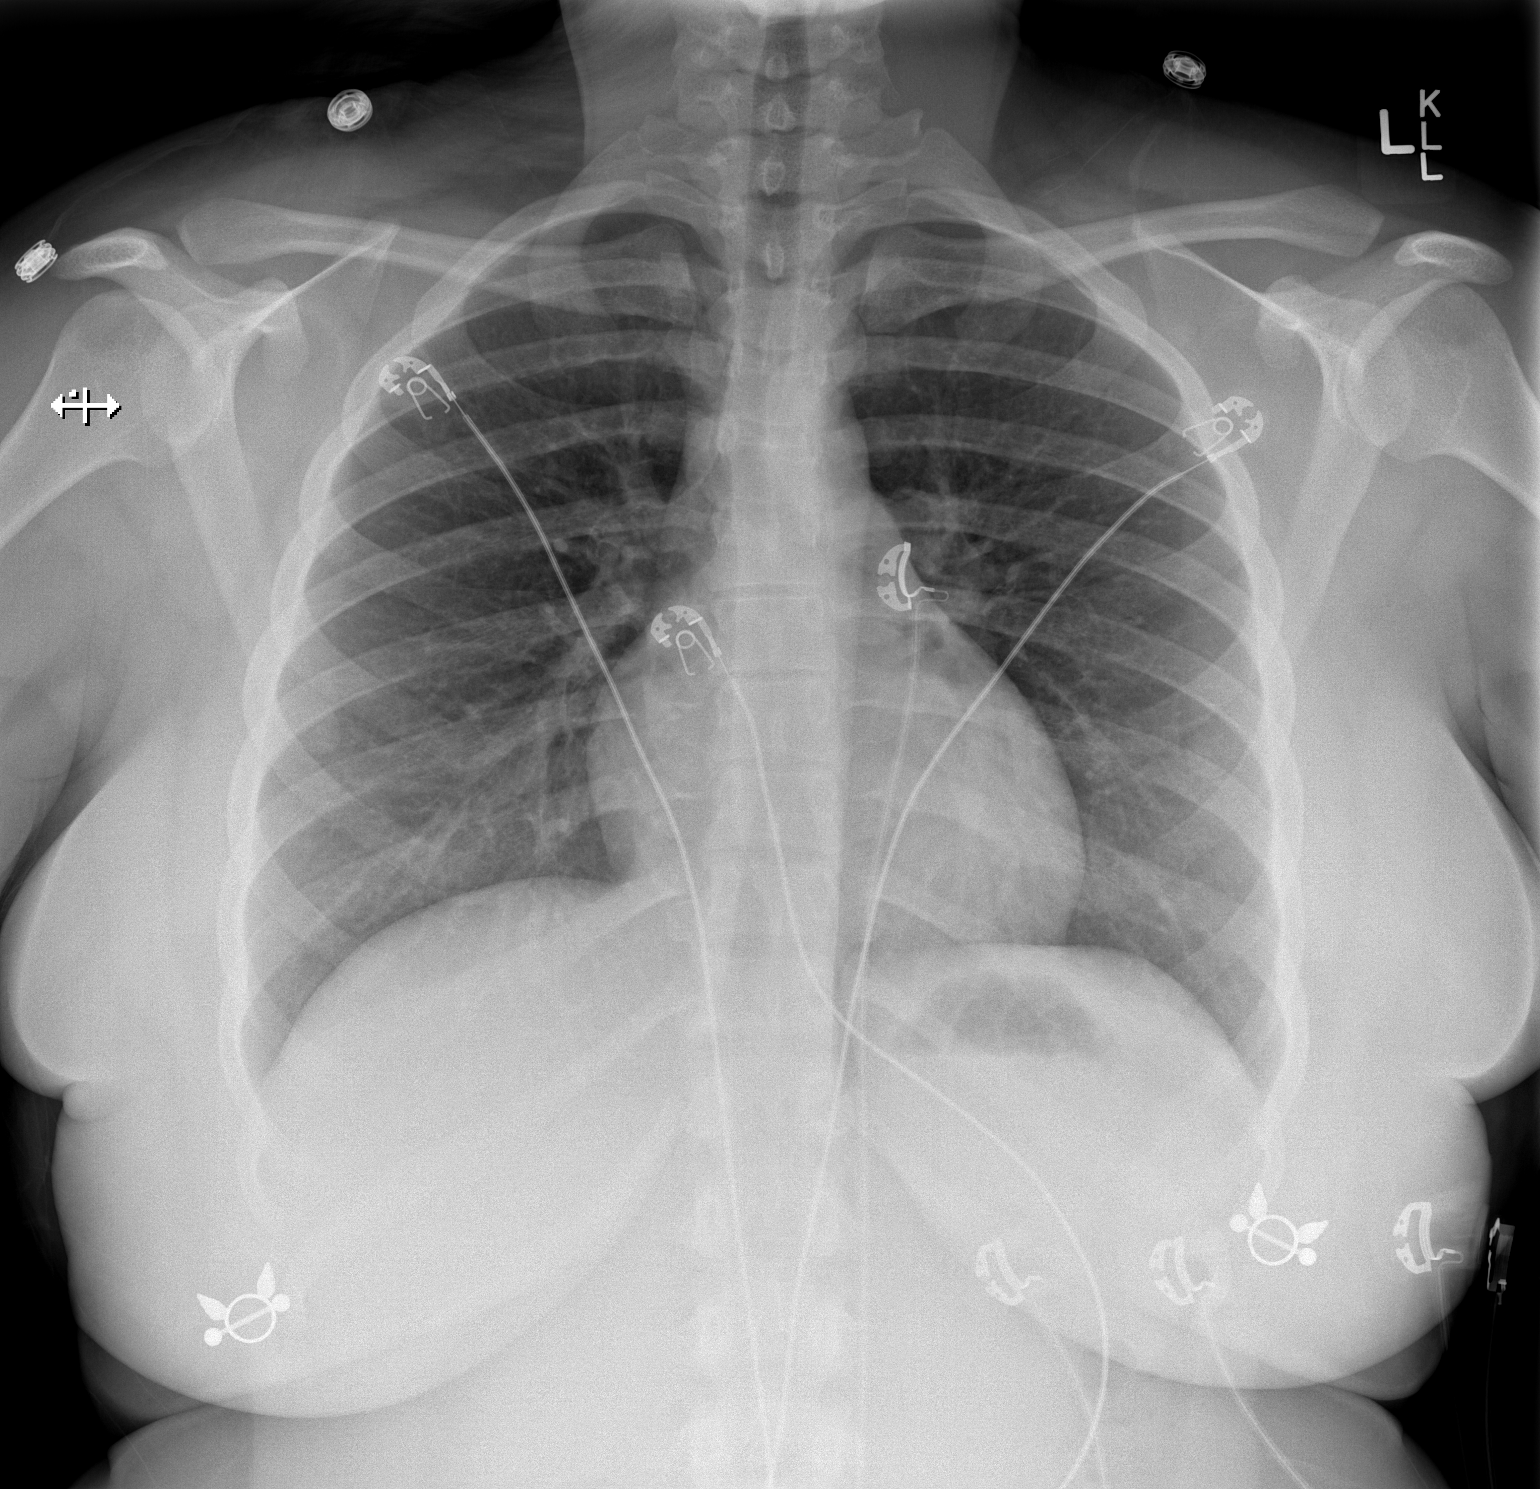

[w chest lat]
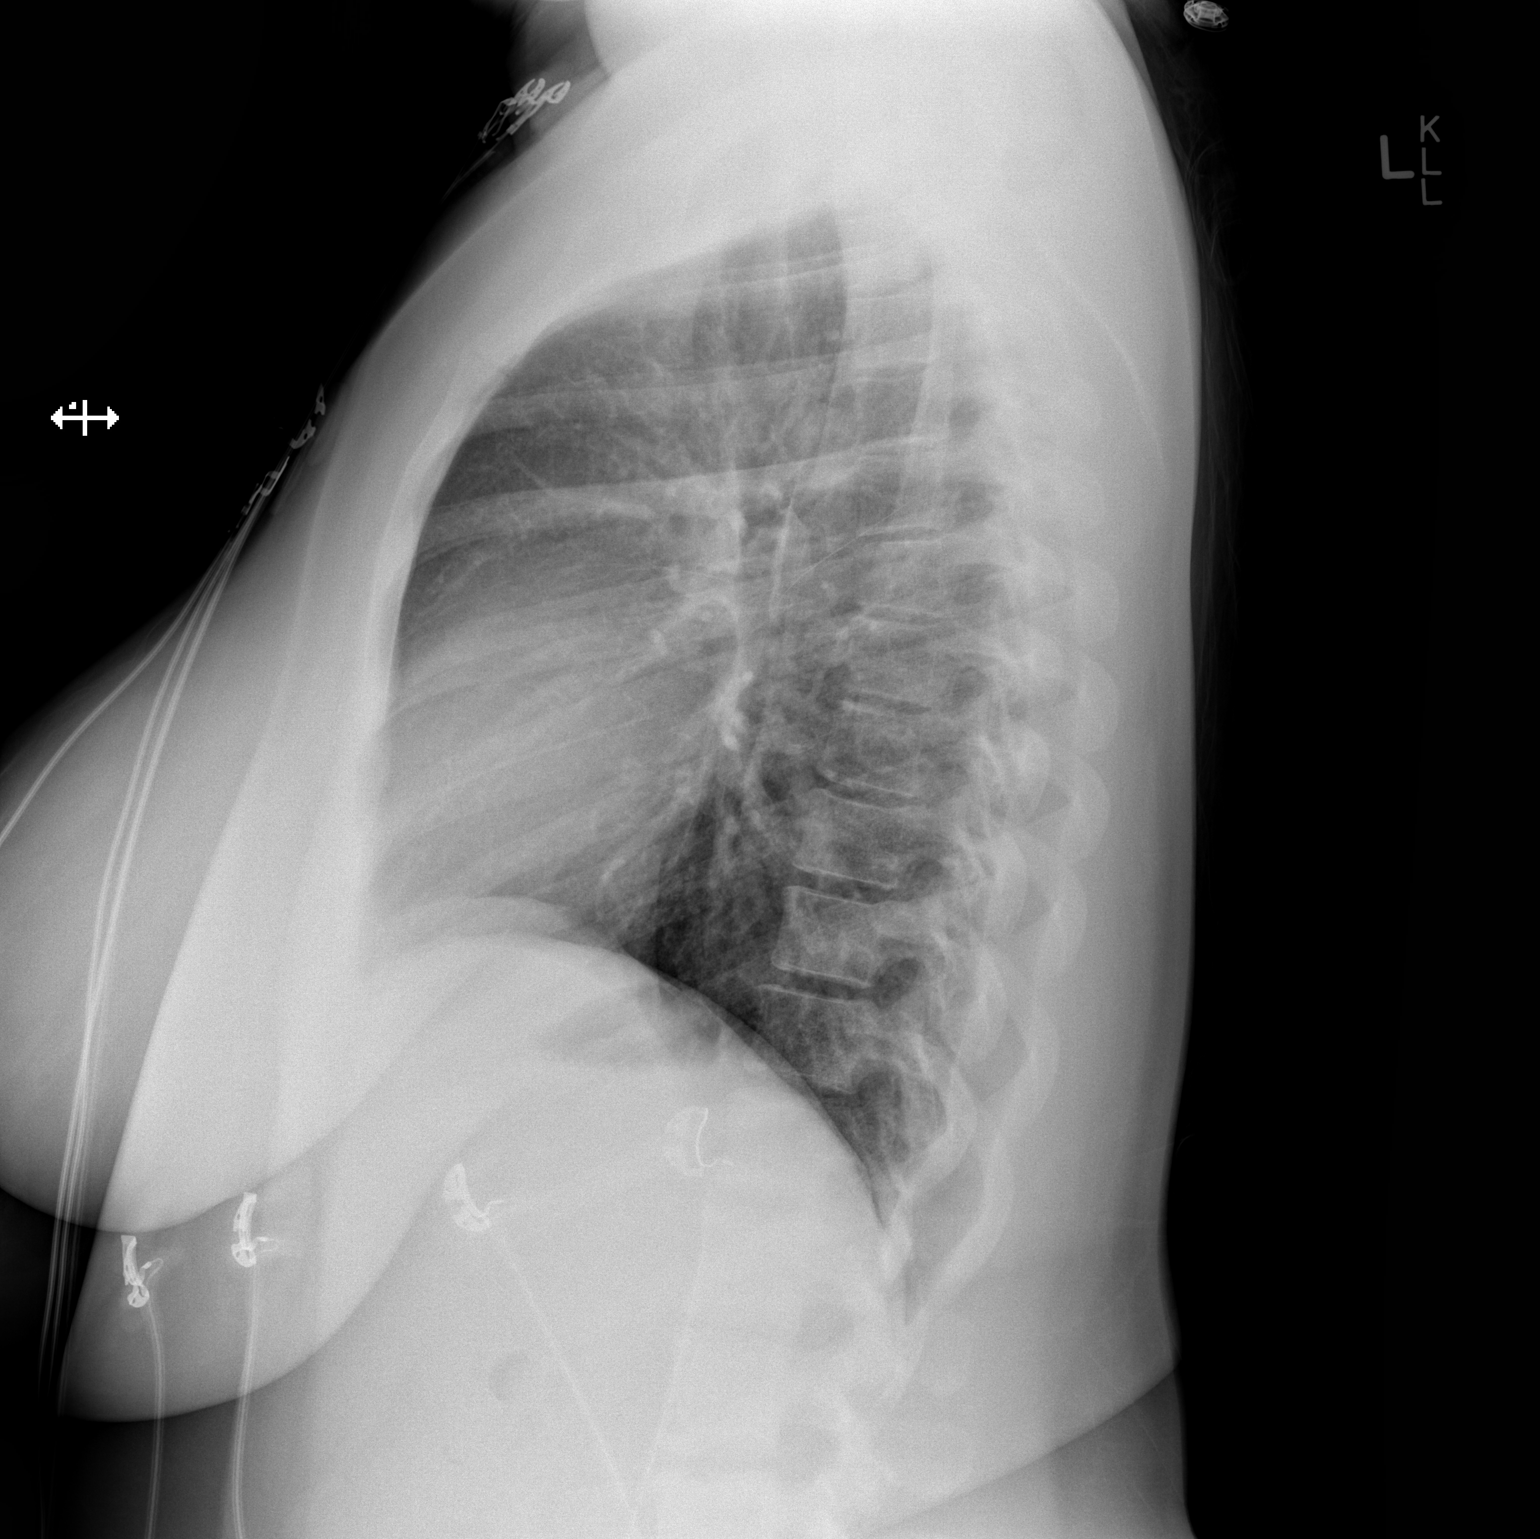

[2 of 2 positions shown; findings below may reference images not displayed]

FINDINGS: Lungs are clear.

Heart size and mediastinal contours are within normal limits.

No effusion.  No pneumothorax.

Visualized bones unremarkable.
IMPRESSION: No acute cardiopulmonary disease.

## 2017-04-10 IMAGING — CT CT RENAL STONE PROTOCOL
2 of 3 series · 16 of 46 positions shown, 18 images · non-contrast
Comparison: Chest radiographs 05/02/2016.

CLINICAL DATA: 19-year-old female with lower back and right flank
pain. Treated 3 days ago for urinary tract infection. Continued pain
and chills. Gross hematuria for 1 week. Initial encounter.

EXAM:
CT ABDOMEN AND PELVIS WITHOUT CONTRAST
TECHNIQUE: Multidetector CT imaging of the abdomen and pelvis was performed
following the standard protocol without IV contrast.

[Series 4: lung · axial · 0.64mm/px · z∈[-87,-9]mm · 13 of 45 slices shown, 15 images]
[im 3/45  soft-tissue]
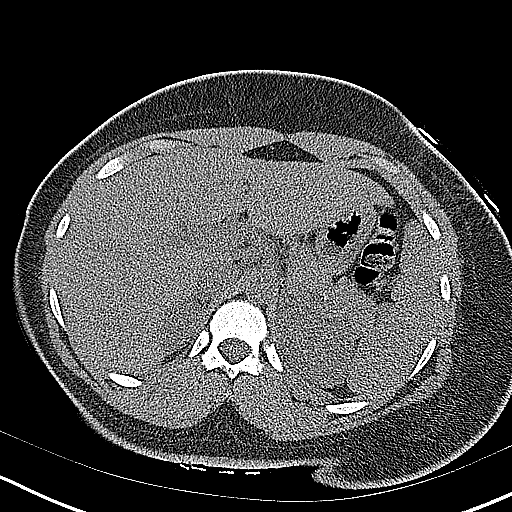
[im 3/45  bone]
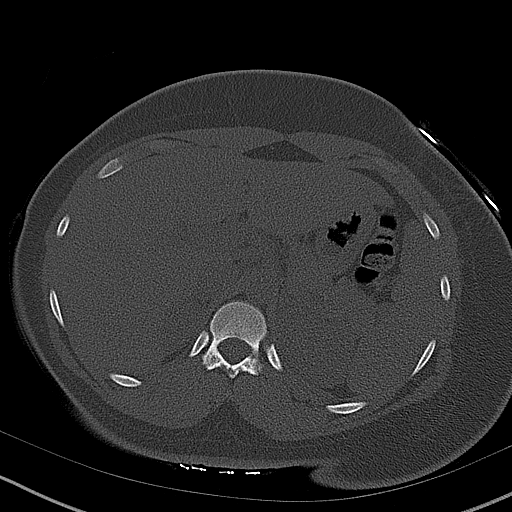
[im 6/45  soft-tissue]
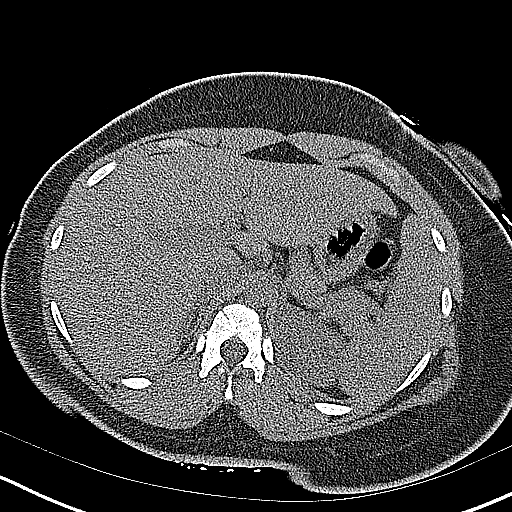
[im 9/45  soft-tissue]
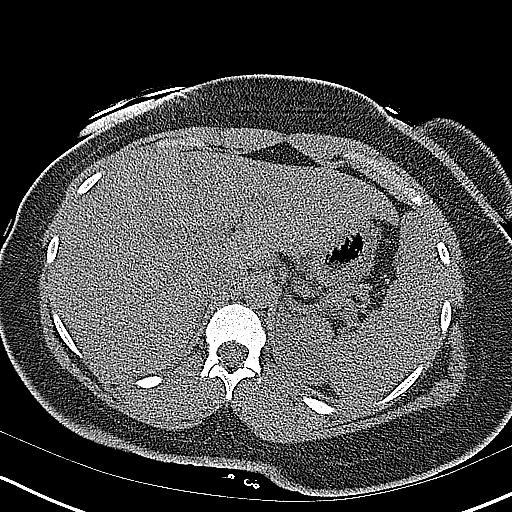
[im 13/45  soft-tissue]
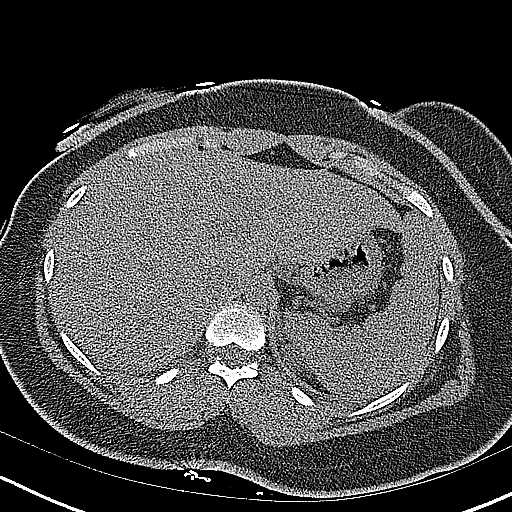
[im 16/45  soft-tissue]
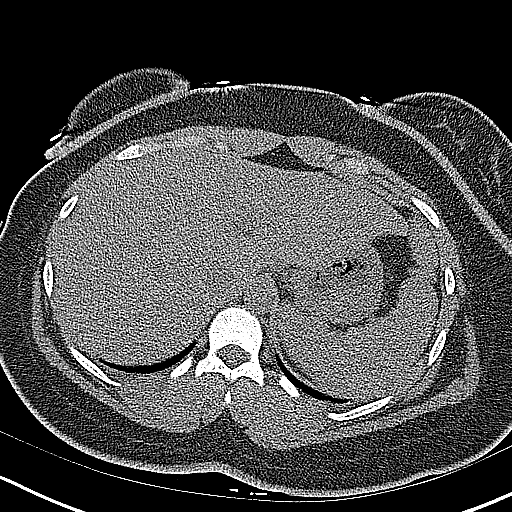
[im 19/45  soft-tissue]
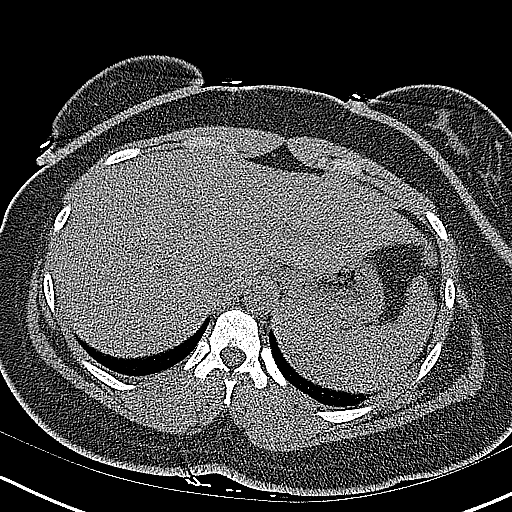
[im 23/45  soft-tissue]
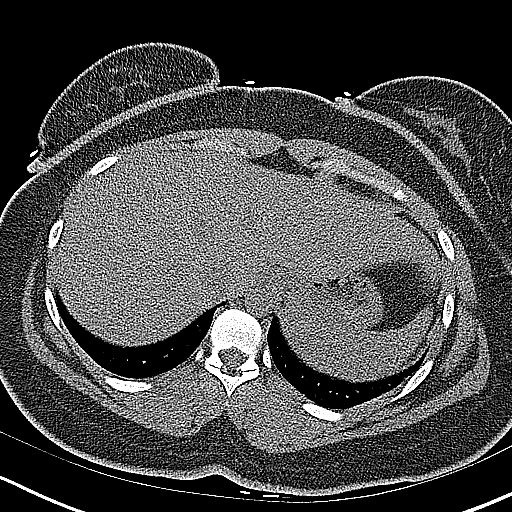
[im 26/45  soft-tissue]
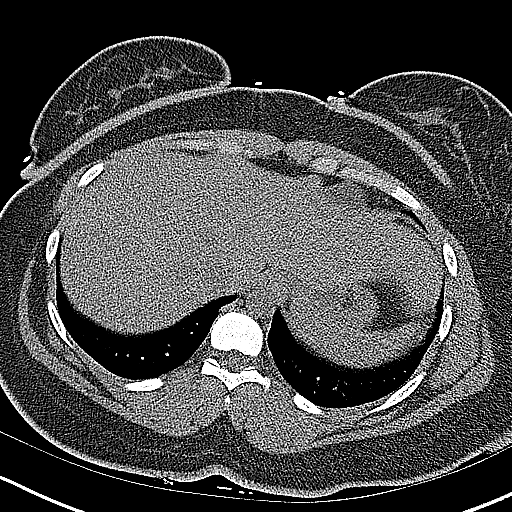
[im 29/45  soft-tissue]
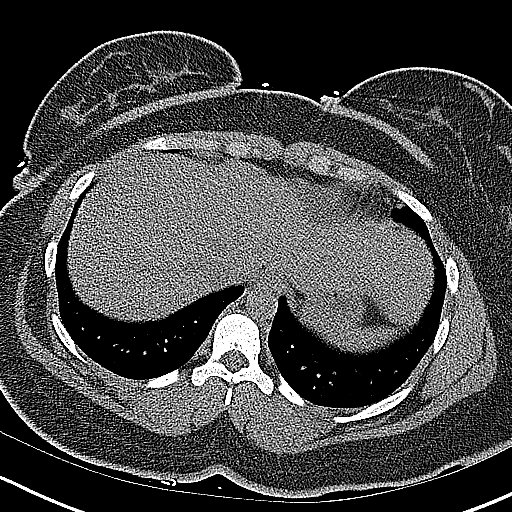
[im 29/45  bone]
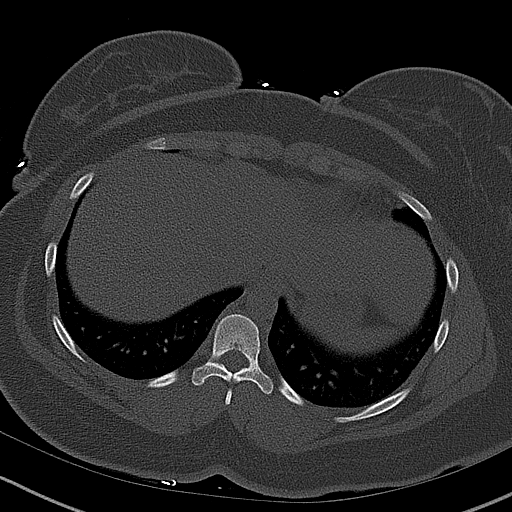
[im 32/45  soft-tissue]
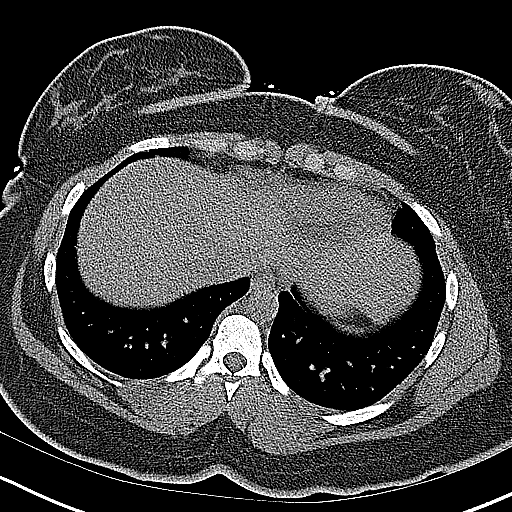
[im 36/45  soft-tissue]
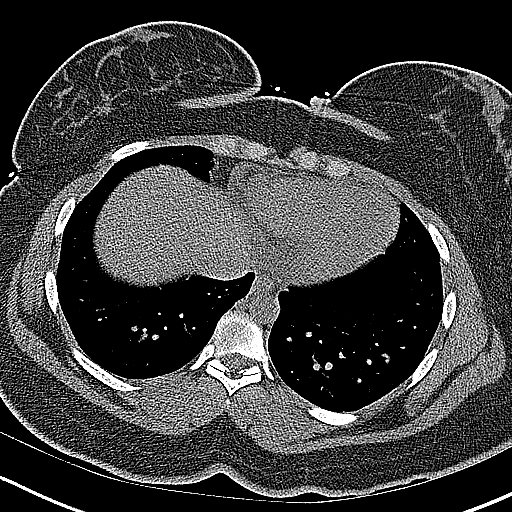
[im 39/45  soft-tissue]
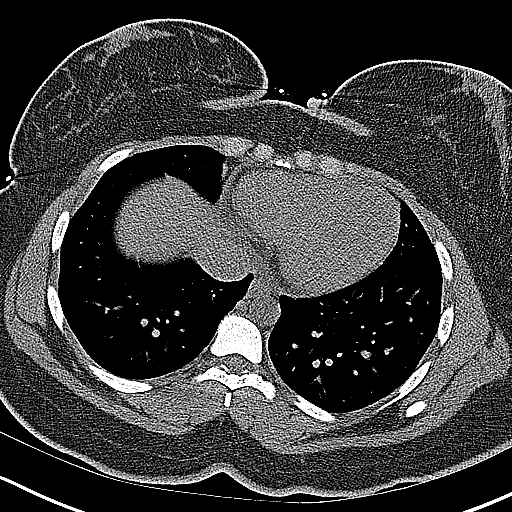
[im 42/45  soft-tissue]
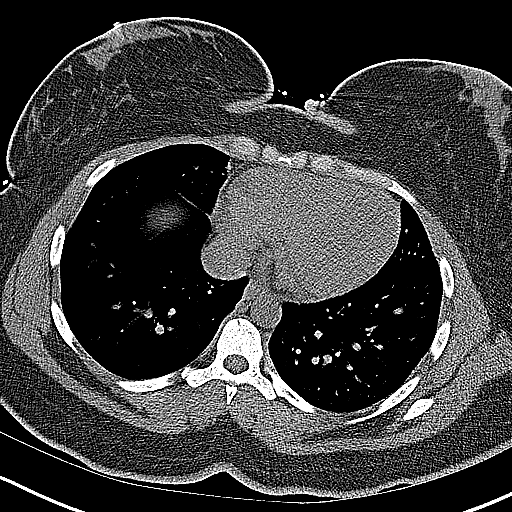

[Series 5: coronal · coronal · 0.73mm/px · 3 of 167 slices shown]
[im 56/167  soft-tissue]
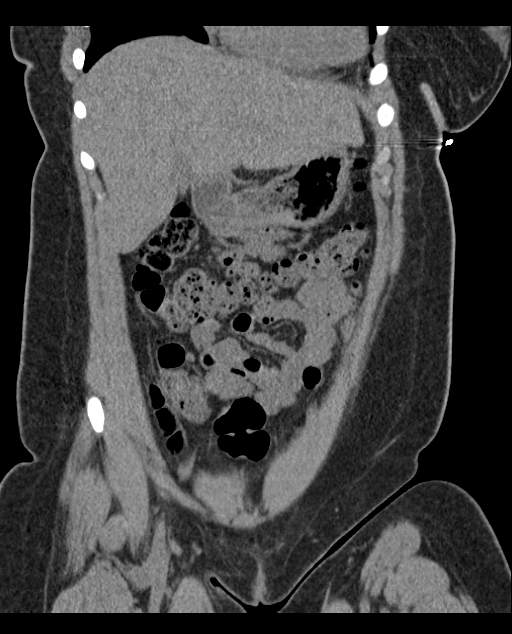
[im 74/167  soft-tissue]
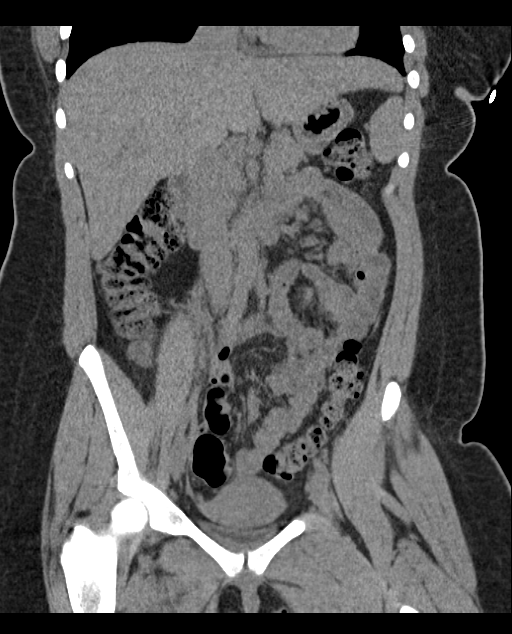
[im 93/167  soft-tissue]
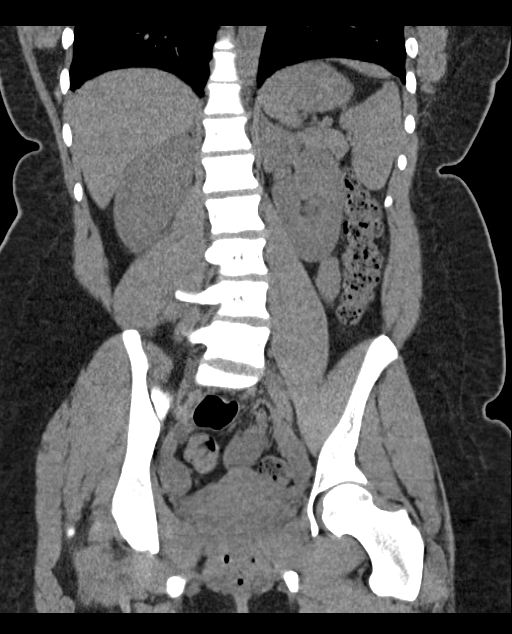

[16 of 46 positions shown; findings below may reference images not displayed]

FINDINGS: Negative lung bases.  No pericardial or pleural effusion.

Mild scoliosis.  Otherwise no osseous abnormality identified.

Small volume pelvic free fluid in the cul-de-sac. Negative
noncontrast uterus and adnexa. The distal colon is redundant with
retained stool, but otherwise negative.

Retained stool in the left colon and transverse colon. Lesser
retained stool in the right colon. Normal appendix tracking along
the right pelvic sidewall from the cecum (coronal image 73).
Negative noncontrast terminal ileum. No dilated small bowel.
Negative stomach and duodenum.

No abdominal free air or free fluid. Noncontrast liver, gallbladder
(contracted), spleen, pancreas and adrenal glands are normal.

Negative noncontrast left kidney. Proximal left ureter is not
dilated. The distal left ureter is difficult to delineate. The
urinary bladder is almost completely decompressed.

The noncontrast right kidney also appears within normal limits. The
proximal right ureter is not dilated. The distal right ureter is
difficult to delineate. There is only a punctate calcification along
the right pelvic floor which is too posterior to be along the course
of the right ureter.
IMPRESSION: No urologic calculus identified. Negative noncontrast CT appearance
of the kidneys and urinary bladder.

Trace pelvic free fluid, but likely physiologic.

Normal appendix.

## 2018-06-28 ENCOUNTER — Other Ambulatory Visit: Payer: Self-pay

## 2018-06-28 ENCOUNTER — Encounter (HOSPITAL_COMMUNITY): Payer: Self-pay | Admitting: *Deleted

## 2018-06-28 ENCOUNTER — Emergency Department (HOSPITAL_COMMUNITY)
Admission: EM | Admit: 2018-06-28 | Discharge: 2018-06-28 | Disposition: A | Discharge status: Home / Self Care | Payer: Self-pay | Attending: Emergency Medicine | Admitting: Emergency Medicine

## 2018-06-28 DIAGNOSIS — H9202 Otalgia, left ear: Secondary | ICD-10-CM | POA: Insufficient documentation

## 2018-06-28 DIAGNOSIS — J029 Acute pharyngitis, unspecified: Secondary | ICD-10-CM | POA: Insufficient documentation

## 2018-06-28 DIAGNOSIS — J45909 Unspecified asthma, uncomplicated: Secondary | ICD-10-CM | POA: Insufficient documentation

## 2018-06-28 DIAGNOSIS — M791 Myalgia, unspecified site: Secondary | ICD-10-CM | POA: Insufficient documentation

## 2018-06-28 DIAGNOSIS — R51 Headache: Secondary | ICD-10-CM | POA: Insufficient documentation

## 2018-06-28 LAB — GROUP A STREP BY PCR: Group A Strep by PCR: NOT DETECTED

## 2018-06-28 MED ORDER — MAGIC MOUTHWASH: 5.0000 mL | Freq: Three times a day (TID) | ORAL | 0 refills | Status: AC

## 2018-06-28 MED ORDER — ACETAMINOPHEN 325 MG PO TABS
650.0000 mg | ORAL_TABLET | Freq: Once | ORAL | Status: AC | PRN
  Administered 2018-06-28: 650 mg via ORAL
  Filled 2018-06-28: qty 2

## 2018-06-28 MED ORDER — PENICILLIN G BENZATHINE & PROC 1200000 UNIT/2ML IM SUSP
1.2000 10*6.[IU] | Freq: Once | INTRAMUSCULAR | Status: AC
  Administered 2018-06-28: 1.2 10*6.[IU] via INTRAMUSCULAR
  Filled 2018-06-28: qty 2

## 2018-06-28 MED ORDER — FLUCONAZOLE 150 MG PO TABS: 150.0000 mg | ORAL_TABLET | Freq: Every day | ORAL | 0 refills | Status: AC

## 2018-06-28 NOTE — ED Triage Notes (Signed)
Pt reports sore throat with fever x 2-3 days ago. She feels nauseous but denies any vomiting.

## 2018-06-28 NOTE — ED Provider Notes (Signed)
[facility]-EMERGENCY DEPT Provider Note   CSN: [id number] Arrival date & time: 06/28/18  1952     History   Chief Complaint Chief Complaint  Patient presents with  . Sore Throat    HPI Brittany West is a 22 y.o. female.  HPI   Pt is a 22 y/o female who presents to the ED today c/o a sore throat the began 3 days ago. Also reports associated fevers, chills, body aches, headaches, left ear pain. Denies any cough, sob, or CP. Denies abd pain, vomiting, diarrhea, urinary sxs. Had some nausea in the waiting room that has resolved.   Pt states she has not been eating and drinking due to pain with swallowing. Has tried taking ibuprofen with no relief.   Past Medical History:  Diagnosis Date  . Asthma   . Bacterial vaginosis   . UTI (lower urinary tract infection)   . Yeast infection     There are no active problems to display for this patient.   Past Surgical History:  Procedure Laterality Date  . EYE SURGERY       OB History   None      Home Medications    Prior to Admission medications   Medication Sig Start Date End Date Taking? Authorizing Provider  ibuprofen (ADVIL,MOTRIN) 200 MG tablet Take 400 mg by mouth every 6 (six) hours as needed for mild pain or moderate pain.   Yes [provider]  Lactobacillus (ACIDOPHILUS PROBIOTIC) 10 MG TABS Take 10 mg by mouth 3 (three) times daily. Patient not taking: Reported on 09/18/2016 07/03/16   Everlene Farrieransie, William, PA-C  loperamide (IMODIUM) 2 MG capsule Take 1 capsule (2 mg total) by mouth 4 (four) times daily as needed for diarrhea or loose stools. Patient not taking: Reported on 09/18/2016 07/03/16   Everlene Farrieransie, William, PA-C  metroNIDAZOLE (FLAGYL) 500 MG tablet Take 1 tablet (500 mg total) by mouth 2 (two) times daily. Patient not taking: Reported on 06/28/2018 09/18/16   Long, Arlyss RepressJoshua G, MD  naproxen (NAPROSYN) 500 MG tablet Take 1 tablet (500 mg total) by mouth 2 (two) times daily with a meal. Patient  not taking: Reported on 09/18/2016 07/03/16   Everlene Farrieransie, William, PA-C    Family History No family history on file.  Social History Social History   Tobacco Use  . Smoking status: Never Smoker  . Smokeless tobacco: Never Used  Substance Use Topics  . Alcohol use: No  . Drug use: No     Allergies   Patient has no known allergies.   Review of Systems Review of Systems  Constitutional: Positive for chills, diaphoresis and fever.  HENT: Positive for ear pain and sore throat. Negative for congestion and rhinorrhea.   Eyes: Negative for visual disturbance.  Respiratory: Negative for cough and shortness of breath.   Cardiovascular: Negative for chest pain.  Gastrointestinal: Positive for nausea. Negative for abdominal pain, blood in stool, constipation, diarrhea and vomiting.  Genitourinary: Negative for dysuria and urgency.  Musculoskeletal: Negative for back pain.  Skin: Negative for color change.  Neurological: Positive for headaches. Negative for dizziness, light-headedness and numbness.   Physical Exam Updated Vital Signs BP 124/82 (BP Location: Right Arm)   Pulse 97   Temp (!) 101.9 F (38.8 C) (Oral)   Resp 18   Ht 5\' 3"  (1.6 m)   Wt 86.2 kg   LMP 06/27/2018   SpO2 97%   BMI 33.66 kg/m   Physical Exam  Constitutional: She appears  well-developed and well-nourished. No distress.  Nontoxic.  No acute distress.  HENT:  Head: Normocephalic and atraumatic.  Right Ear: Hearing, tympanic membrane and ear canal normal.  Left Ear: Hearing, tympanic membrane and ear canal normal.  Mouth/Throat: Oropharynx is clear and moist.  Pharyngeal erythema, bilateral tonsillar edema with exudates.  No unilateral tonsillar swelling.  No uvular deviation.  Tolerating secretions with normal voice.  Has mild cervical adenopathy, more so on the left.  Eyes: Pupils are equal, round, and reactive to light. Conjunctivae are normal.  Neck: Neck supple.  Cardiovascular: Normal rate, regular  rhythm, normal heart sounds and intact distal pulses.  No murmur heard. Pulmonary/Chest: Effort normal and breath sounds normal. No respiratory distress. She has no wheezes. She has no rhonchi. She has no rales.  Abdominal: Soft. Bowel sounds are normal. She exhibits no distension. There is no tenderness.  Musculoskeletal: She exhibits no edema.  Neurological: She is alert.  Skin: Skin is warm and dry. Capillary refill takes less than 2 seconds.  Psychiatric: She has a normal mood and affect.  Nursing note and vitals reviewed.  ED Treatments / Results  Labs (all labs ordered are listed, but only abnormal results are displayed) Labs Reviewed  GROUP A STREP BY PCR    EKG None  Radiology No results found.  Procedures Procedures (including critical care time)  Medications Ordered in ED Medications  acetaminophen (TYLENOL) tablet 650 mg (650 mg Oral Given 06/28/18 2137)  penicillin g procaine-penicillin g benzathine (BICILLIN-CR) injection 600000-600000 units (1.2 Million Units Intramuscular Given 06/28/18 2301)     Initial Impression / Assessment and Plan / ED Course  I have reviewed the triage vital signs and the nursing notes.  Pertinent labs & imaging results that were available during my care of the patient were reviewed by me and considered in my medical decision making (see chart for details).  Final Clinical Impressions(s) / ED Diagnoses   Final diagnoses:  Suppurative pharyngitis   Patient presenting with sore throat, with tonsillar swelling and exudates.  Has cervical lymphadenopathy as well and is initially febrile and tachycardic.  Vital signs improved after menstruation of Tylenol and p.o. hydration in the ED.  Patient is nontoxic-appearing.  No concern for deep space infection such as PTA or retropharyngeal abscess.  Clinically, she does appear to have symptoms suggestive of strep throat.  Strep test is negative.  Given high clinical suspicion for bacterial  infection, will treat with IM penicillin and have her follow-up with her PCP next week for reevaluation.  Advised to return to the ER for any worsening symptoms in the meantime.  All questions answered and patient stable for discharge.  ED Discharge Orders    None       Rayne DuCouture, Mahlani Berninger S, PA-C 06/28/18 2306    Virgina NorfolkCuratolo, Adam, DO 06/29/18 1115

## 2018-06-28 NOTE — Discharge Instructions (Addendum)
Please follow up with your primary care provider within 5-7 days for re-evaluation of your symptoms. If you do not have a primary care provider, information for a healthcare clinic has been provided for you to make arrangements for follow up care. Please return to the emergency department for any new or worsening symptoms. ° °

## 2020-03-30 ENCOUNTER — Ambulatory Visit: Payer: Medicaid Other | Attending: Internal Medicine

## 2020-03-30 DIAGNOSIS — Z20822 Contact with and (suspected) exposure to covid-19: Secondary | ICD-10-CM

## 2020-03-31 LAB — SARS-COV-2, NAA 2 DAY TAT

## 2020-03-31 LAB — NOVEL CORONAVIRUS, NAA: SARS-CoV-2, NAA: NOT DETECTED

## 2020-04-01 ENCOUNTER — Telehealth: Payer: Self-pay | Admitting: General Practice

## 2020-04-01 NOTE — Telephone Encounter (Signed)
Pt aware covid lab test negative, covid not detected 

## 2020-05-15 ENCOUNTER — Ambulatory Visit: Payer: Medicaid Other | Attending: Internal Medicine

## 2020-05-15 DIAGNOSIS — Z20822 Contact with and (suspected) exposure to covid-19: Secondary | ICD-10-CM

## 2020-05-16 LAB — SARS-COV-2, NAA 2 DAY TAT

## 2020-05-16 LAB — NOVEL CORONAVIRUS, NAA: SARS-CoV-2, NAA: NOT DETECTED

## 2022-08-22 ENCOUNTER — Institutional Professional Consult (permissible substitution): Payer: Medicaid Other | Admitting: Plastic Surgery

## 2022-08-25 ENCOUNTER — Institutional Professional Consult (permissible substitution): Payer: Medicaid Other | Admitting: Plastic Surgery

## 2022-10-04 ENCOUNTER — Other Ambulatory Visit: Payer: Self-pay | Admitting: Plastic Surgery

## 2023-05-26 ENCOUNTER — Ambulatory Visit
Admission: EM | Admit: 2023-05-26 | Discharge: 2023-05-26 | Disposition: A | Discharge status: Home / Self Care | Payer: Medicaid Other | Attending: Emergency Medicine | Admitting: Emergency Medicine

## 2023-05-26 ENCOUNTER — Encounter: Payer: Self-pay | Admitting: Emergency Medicine

## 2023-05-26 DIAGNOSIS — N6452 Nipple discharge: Secondary | ICD-10-CM

## 2023-05-26 DIAGNOSIS — L309 Dermatitis, unspecified: Secondary | ICD-10-CM | POA: Insufficient documentation

## 2023-05-26 MED ORDER — HYDROXYZINE HCL 25 MG PO TABS: 25.0000 mg | ORAL_TABLET | Freq: Four times a day (QID) | ORAL | 0 refills | Status: AC | PRN

## 2023-05-26 NOTE — ED Provider Notes (Signed)
[facility]    CSN: [id number] Arrival date & time: 05/26/23  0955      History   Chief Complaint Chief Complaint  Patient presents with   Rash    HPI Brittany West is a 27 y.o. female.   27 year old female, [place], presents to urgent care complaining of rash generalized for 6 days.  Patient has a history of eczema, use friends soap that she normally does not use and noticed the bumps afterwards.  Patient also reports she has clear leakage from bilateral breast since breast reduction surgery November of last year.  Patient states she had nipple rings and took them out and symptoms resolved, has not returned to the nipple rings but still has the drainage has not followed up with her surgeon.  The history is provided by the patient. No language interpreter was used.    Past Medical History:  Diagnosis Date   Asthma    Bacterial vaginosis    UTI (lower urinary tract infection)    Yeast infection     There are no problems to display for this patient.   Past Surgical History:  Procedure Laterality Date   BREAST SURGERY     EYE SURGERY      OB History   No obstetric history on file.      Home Medications    Prior to Admission medications   Medication Sig Start Date End Date Taking? Authorizing Provider  ibuprofen (ADVIL,MOTRIN) 200 MG tablet Take 400 mg by mouth every 6 (six) hours as needed for mild pain or moderate pain.    [provider]  Lactobacillus (ACIDOPHILUS PROBIOTIC) 10 MG TABS Take 10 mg by mouth 3 (three) times daily. Patient not taking: Reported on 09/18/2016 07/03/16   Everlene Farrier, PA-C  loperamide (IMODIUM) 2 MG capsule Take 1 capsule (2 mg total) by mouth 4 (four) times daily as needed for diarrhea or loose stools. Patient not taking: Reported on 09/18/2016 07/03/16   Everlene Farrier, PA-C  magic mouthwash SOLN Take 5 mLs by mouth 3 (three) times daily. Do not swallow this medication.  Switch the medication and spit it out  after use. 06/28/18   Couture, Cortni S, PA-C  metroNIDAZOLE (FLAGYL) 500 MG tablet Take 1 tablet (500 mg total) by mouth 2 (two) times daily. Patient not taking: Reported on 06/28/2018 09/18/16   Long, Arlyss Repress, MD  naproxen (NAPROSYN) 500 MG tablet Take 1 tablet (500 mg total) by mouth 2 (two) times daily with a meal. Patient not taking: Reported on 09/18/2016 07/03/16   Everlene Farrier, PA-C    Family History No family history on file.  Social History Social History   Tobacco Use   Smoking status: Never   Smokeless tobacco: Never  Substance Use Topics   Alcohol use: No   Drug use: No     Allergies   Patient has no known allergies.   Review of Systems Review of Systems  Skin:  Positive for rash.  All other systems reviewed and are negative.    Physical Exam Triage Vital Signs ED Triage Vitals  Encounter Vitals Group     BP 05/26/23 1020 124/86     Systolic BP Percentile --      Diastolic BP Percentile --      Pulse Rate 05/26/23 1020 69     Resp 05/26/23 1020 16     Temp 05/26/23 1020 98.2 F (36.8 C)     Temp Source 05/26/23 1020 Oral  SpO2 05/26/23 1020 97 %     Weight 05/26/23 1018 180 lb (81.6 kg)     Height 05/26/23 1018 5\' 3"  (1.6 m)     Head Circumference --      Peak Flow --      Pain Score 05/26/23 1018 0     Pain Loc --      Pain Education --      Exclude from Growth Chart --    No data found.  Updated Vital Signs BP 124/86 (BP Location: Left Arm)   Pulse 69   Temp 98.2 F (36.8 C) (Oral)   Resp 16   Ht 5\' 3"  (1.6 m)   Wt 180 lb (81.6 kg)   LMP 05/24/2023   SpO2 97%   BMI 31.89 kg/m   Visual Acuity Right Eye Distance:   Left Eye Distance:   Bilateral Distance:    Right Eye Near:   Left Eye Near:    Bilateral Near:     Physical Exam Vitals and nursing note reviewed. Exam conducted with a chaperone present.  Constitutional:      General: She is not in acute distress.    Appearance: She is well-developed and well-groomed.   HENT:     Head: Normocephalic and atraumatic.  Eyes:     Conjunctiva/sclera: Conjunctivae normal.  Cardiovascular:     Rate and Rhythm: Normal rate and regular rhythm.     Pulses: Normal pulses.     Heart sounds: Normal heart sounds. No murmur heard. Pulmonary:     Effort: Pulmonary effort is normal. No respiratory distress.     Breath sounds: Normal breath sounds and air entry.  Chest:  Breasts:    Right: Nipple discharge present. No swelling, inverted nipple or tenderness.     Left: Nipple discharge present. No swelling, inverted nipple or tenderness.     Comments: Clear discharge Abdominal:     Palpations: Abdomen is soft.     Tenderness: There is no abdominal tenderness.  Musculoskeletal:        General: No swelling.     Cervical back: Neck supple.  Skin:    General: Skin is warm and dry.     Capillary Refill: Capillary refill takes less than 2 seconds.  Neurological:     General: No focal deficit present.     Mental Status: She is alert and oriented to person, place, and time.     GCS: GCS eye subscore is 4. GCS verbal subscore is 5. GCS motor subscore is 6.  Psychiatric:        Attention and Perception: Attention normal.        Mood and Affect: Mood normal.        Speech: Speech normal.        Behavior: Behavior normal. Behavior is cooperative.      UC Treatments / Results  Labs (all labs ordered are listed, but only abnormal results are displayed) Labs Reviewed - No data to display  EKG   Radiology No results found.  Procedures Procedures (including critical care time)  Medications Ordered in UC Medications - No data to display  Initial Impression / Assessment and Plan / UC Course  I have reviewed the triage vital signs and the nursing notes.  Pertinent labs & imaging results that were available during my care of the patient were reviewed by me and considered in my medical decision making (see chart for details).     Ddx: Rash, eczema, allergies,  breast  discharge Final Clinical Impressions(s) / UC Diagnoses   Final diagnoses:  None   Discharge Instructions   None    ED Prescriptions   None    PDMP not reviewed this encounter.   Clancy Gourd, NP 05/26/23 1234

## 2023-05-26 NOTE — Discharge Instructions (Addendum)
Please make an appointment to follow-up with your breast reduction surgeon regarding breast issues-call today for appointment as soon as possible.  Wear supportive bra.  Take Atarax as directed for itching.  Avoid hot water showers that makes rashes worse, avoid unknown detergent use as it may aggravate your eczema can cause a dermatitis.  Follow-up with PCP for referral to dermatology

## 2023-05-26 NOTE — ED Triage Notes (Signed)
Patient in office today c/o of rash on abdomen, arm and leg started since 6d with itch with bumps. Also complains of leakage in bilateral breast since 05/23/2023   OTC: none Denies; N/V

## 2023-06-01 ENCOUNTER — Other Ambulatory Visit: Payer: Self-pay | Admitting: Physician Assistant

## 2023-06-01 DIAGNOSIS — N6452 Nipple discharge: Secondary | ICD-10-CM

## 2023-06-26 ENCOUNTER — Other Ambulatory Visit: Payer: Medicaid Other

## 2023-07-26 ENCOUNTER — Ambulatory Visit
Admission: RE | Admit: 2023-07-26 | Discharge: 2023-07-26 | Disposition: A | Discharge status: Home / Self Care | Payer: Medicaid Other | Source: Ambulatory Visit | Attending: Physician Assistant

## 2023-07-26 ENCOUNTER — Ambulatory Visit
Admission: RE | Admit: 2023-07-26 | Discharge: 2023-07-26 | Disposition: A | Discharge status: Home / Self Care | Payer: Medicaid Other | Source: Ambulatory Visit | Attending: Physician Assistant | Admitting: Physician Assistant

## 2023-07-26 DIAGNOSIS — N6452 Nipple discharge: Secondary | ICD-10-CM

## 2023-08-01 ENCOUNTER — Other Ambulatory Visit: Payer: Self-pay | Admitting: Physician Assistant

## 2023-08-01 DIAGNOSIS — N6452 Nipple discharge: Secondary | ICD-10-CM
